# Patient Record
Sex: Female | Born: 1980 | Race: White | Hispanic: No | Smoking: Current every day smoker
Health system: Southern US, Community
[De-identification: ages and names within clinical notes are randomized; demographics above are authoritative.]

## PROBLEM LIST (undated history)

## (undated) DIAGNOSIS — K219 Gastro-esophageal reflux disease without esophagitis: Secondary | ICD-10-CM

## (undated) DIAGNOSIS — G43909 Migraine, unspecified, not intractable, without status migrainosus: Secondary | ICD-10-CM

## (undated) DIAGNOSIS — IMO0002 Reserved for concepts with insufficient information to code with codable children: Secondary | ICD-10-CM

## (undated) DIAGNOSIS — F329 Major depressive disorder, single episode, unspecified: Secondary | ICD-10-CM

## (undated) DIAGNOSIS — M1990 Unspecified osteoarthritis, unspecified site: Secondary | ICD-10-CM

## (undated) DIAGNOSIS — T8859XA Other complications of anesthesia, initial encounter: Secondary | ICD-10-CM

## (undated) DIAGNOSIS — E079 Disorder of thyroid, unspecified: Secondary | ICD-10-CM

## (undated) DIAGNOSIS — F419 Anxiety disorder, unspecified: Secondary | ICD-10-CM

## (undated) DIAGNOSIS — M549 Dorsalgia, unspecified: Secondary | ICD-10-CM

## (undated) DIAGNOSIS — F32A Depression, unspecified: Secondary | ICD-10-CM

## (undated) HISTORY — PX: CHOLECYSTECTOMY: SHX55

## (undated) HISTORY — DX: Dorsalgia, unspecified: M54.9

## (undated) HISTORY — DX: Disorder of thyroid, unspecified: E07.9

## (undated) HISTORY — PX: TONSILLECTOMY: SUR1361

---

## 2004-02-28 DIAGNOSIS — F1421 Cocaine dependence, in remission: Secondary | ICD-10-CM | POA: Insufficient documentation

## 2004-07-26 ENCOUNTER — Emergency Department (HOSPITAL_COMMUNITY): Admission: EM | Admit: 2004-07-26 | Discharge: 2004-07-27 | Payer: Self-pay | Admitting: Emergency Medicine

## 2004-08-08 ENCOUNTER — Emergency Department (HOSPITAL_COMMUNITY): Admission: EM | Admit: 2004-08-08 | Discharge: 2004-08-08 | Payer: Self-pay | Admitting: Emergency Medicine

## 2004-10-09 ENCOUNTER — Emergency Department (HOSPITAL_COMMUNITY): Admission: EM | Admit: 2004-10-09 | Discharge: 2004-10-09 | Payer: Self-pay | Admitting: Emergency Medicine

## 2004-10-25 ENCOUNTER — Emergency Department (HOSPITAL_COMMUNITY): Admission: EM | Admit: 2004-10-25 | Discharge: 2004-10-25 | Payer: Self-pay | Admitting: Emergency Medicine

## 2004-10-25 ENCOUNTER — Inpatient Hospital Stay (HOSPITAL_COMMUNITY): Admission: AD | Admit: 2004-10-25 | Discharge: 2004-10-25 | Payer: Self-pay | Admitting: Obstetrics and Gynecology

## 2004-12-25 ENCOUNTER — Emergency Department (HOSPITAL_COMMUNITY): Admission: EM | Admit: 2004-12-25 | Discharge: 2004-12-25 | Payer: Self-pay | Admitting: Emergency Medicine

## 2005-03-28 ENCOUNTER — Emergency Department (HOSPITAL_COMMUNITY): Admission: EM | Admit: 2005-03-28 | Discharge: 2005-03-28 | Payer: Self-pay | Admitting: *Deleted

## 2005-03-29 ENCOUNTER — Emergency Department (HOSPITAL_COMMUNITY): Admission: EM | Admit: 2005-03-29 | Discharge: 2005-03-29 | Payer: Self-pay | Admitting: *Deleted

## 2005-07-30 ENCOUNTER — Emergency Department (HOSPITAL_COMMUNITY): Admission: EM | Admit: 2005-07-30 | Discharge: 2005-07-30 | Payer: Self-pay | Admitting: Family Medicine

## 2005-08-30 ENCOUNTER — Inpatient Hospital Stay (HOSPITAL_COMMUNITY): Admission: AD | Admit: 2005-08-30 | Discharge: 2005-08-30 | Payer: Self-pay | Admitting: Obstetrics

## 2005-09-04 ENCOUNTER — Inpatient Hospital Stay (HOSPITAL_COMMUNITY): Admission: AD | Admit: 2005-09-04 | Discharge: 2005-09-04 | Payer: Self-pay | Admitting: Obstetrics

## 2005-09-29 ENCOUNTER — Emergency Department (HOSPITAL_COMMUNITY): Admission: EM | Admit: 2005-09-29 | Discharge: 2005-09-29 | Payer: Self-pay | Admitting: Emergency Medicine

## 2005-10-09 ENCOUNTER — Inpatient Hospital Stay (HOSPITAL_COMMUNITY): Admission: AD | Admit: 2005-10-09 | Discharge: 2005-10-10 | Payer: Self-pay | Admitting: Obstetrics

## 2005-10-27 ENCOUNTER — Inpatient Hospital Stay (HOSPITAL_COMMUNITY): Admission: AD | Admit: 2005-10-27 | Discharge: 2005-10-29 | Payer: Self-pay | Admitting: Obstetrics

## 2005-10-28 ENCOUNTER — Encounter (INDEPENDENT_AMBULATORY_CARE_PROVIDER_SITE_OTHER): Payer: Self-pay | Admitting: *Deleted

## 2006-12-14 ENCOUNTER — Emergency Department (HOSPITAL_COMMUNITY): Admission: EM | Admit: 2006-12-14 | Discharge: 2006-12-15 | Payer: Self-pay | Admitting: Emergency Medicine

## 2007-05-13 ENCOUNTER — Emergency Department (HOSPITAL_COMMUNITY): Admission: EM | Admit: 2007-05-13 | Discharge: 2007-05-13 | Payer: Self-pay | Admitting: Emergency Medicine

## 2007-07-09 ENCOUNTER — Encounter (INDEPENDENT_AMBULATORY_CARE_PROVIDER_SITE_OTHER): Payer: Self-pay | Admitting: General Surgery

## 2007-07-09 ENCOUNTER — Inpatient Hospital Stay (HOSPITAL_COMMUNITY): Admission: EM | Admit: 2007-07-09 | Discharge: 2007-07-12 | Payer: Self-pay | Admitting: Emergency Medicine

## 2009-10-30 ENCOUNTER — Emergency Department (HOSPITAL_COMMUNITY): Admission: EM | Admit: 2009-10-30 | Discharge: 2009-10-31 | Payer: Self-pay | Admitting: Emergency Medicine

## 2010-08-15 ENCOUNTER — Emergency Department (HOSPITAL_COMMUNITY): Admission: EM | Admit: 2010-08-15 | Discharge: 2010-08-16 | Payer: Self-pay | Admitting: Emergency Medicine

## 2011-01-11 LAB — DIFFERENTIAL
Basophils Absolute: 0 10*3/uL (ref 0.0–0.1)
Basophils Relative: 0 % (ref 0–1)
Eosinophils Absolute: 0.4 10*3/uL (ref 0.0–0.7)
Eosinophils Relative: 3 % (ref 0–5)
Lymphocytes Relative: 32 % (ref 12–46)
Lymphs Abs: 3.6 10*3/uL (ref 0.7–4.0)
Monocytes Absolute: 0.5 10*3/uL (ref 0.1–1.0)
Monocytes Relative: 5 % (ref 3–12)
Neutro Abs: 6.7 10*3/uL (ref 1.7–7.7)
Neutrophils Relative %: 60 % (ref 43–77)

## 2011-01-11 LAB — WET PREP, GENITAL
Trich, Wet Prep: NONE SEEN
Yeast Wet Prep HPF POC: NONE SEEN

## 2011-01-11 LAB — BASIC METABOLIC PANEL
BUN: 10 mg/dL (ref 6–23)
CO2: 30 mEq/L (ref 19–32)
Calcium: 9.2 mg/dL (ref 8.4–10.5)
Chloride: 103 mEq/L (ref 96–112)
Creatinine, Ser: 0.88 mg/dL (ref 0.4–1.2)
GFR calc Af Amer: >60 mL/min (ref >60)
GFR calc non Af Amer: >60 mL/min (ref >60)
Glucose, Bld: 109 mg/dL — ABNORMAL HIGH (ref 70–99)
Potassium: 3.9 mEq/L (ref 3.5–5.1)
Sodium: 138 mEq/L (ref 135–145)

## 2011-01-11 LAB — URINE CULTURE
Colony Count: 50000
Culture  Setup Time: 201110180300

## 2011-01-11 LAB — URINALYSIS, ROUTINE W REFLEX MICROSCOPIC
Glucose, UA: NEGATIVE mg/dL
Ketones, ur: NEGATIVE mg/dL
Nitrite: NEGATIVE
Protein, ur: NEGATIVE mg/dL
Specific Gravity, Urine: 1.028 (ref 1.005–1.030)
Urobilinogen, UA: 1 mg/dL (ref 0.0–1.0)
pH: 5 (ref 5.0–8.0)

## 2011-01-11 LAB — URINE MICROSCOPIC-ADD ON

## 2011-01-11 LAB — CBC
HCT: 39.2 % (ref 36.0–46.0)
Hemoglobin: 13.7 g/dL (ref 12.0–15.0)
MCH: 30.4 pg (ref 26.0–34.0)
MCHC: 34.9 g/dL (ref 30.0–36.0)
MCV: 87.1 fL (ref 78.0–100.0)
Platelets: 248 10*3/uL (ref 150–400)
RBC: 4.5 MIL/uL (ref 3.87–5.11)
RDW: 13.2 % (ref 11.5–15.5)
WBC: 11.1 10*3/uL — ABNORMAL HIGH (ref 4.0–10.5)

## 2011-01-11 LAB — GC/CHLAMYDIA PROBE AMP, GENITAL
Chlamydia, DNA Probe: NEGATIVE
GC Probe Amp, Genital: NEGATIVE

## 2011-01-11 LAB — PREGNANCY, URINE: Preg Test, Ur: NEGATIVE

## 2011-01-15 LAB — DIFFERENTIAL
Basophils Absolute: 0.1 10*3/uL (ref 0.0–0.1)
Basophils Relative: 1 % (ref 0–1)
Eosinophils Absolute: 0.3 10*3/uL (ref 0.0–0.7)
Eosinophils Relative: 3 % (ref 0–5)
Lymphocytes Relative: 29 % (ref 12–46)
Lymphs Abs: 2.8 10*3/uL (ref 0.7–4.0)
Monocytes Absolute: 0.5 10*3/uL (ref 0.1–1.0)
Monocytes Relative: 5 % (ref 3–12)
Neutro Abs: 6.1 10*3/uL (ref 1.7–7.7)
Neutrophils Relative %: 62 % (ref 43–77)

## 2011-01-15 LAB — D-DIMER, QUANTITATIVE: D-Dimer, Quant: 0.29 ug/mL-FEU (ref 0.00–0.48)

## 2011-01-15 LAB — CBC
HCT: 41.2 % (ref 36.0–46.0)
Hemoglobin: 14.2 g/dL (ref 12.0–15.0)
MCHC: 34.4 g/dL (ref 30.0–36.0)
MCV: 87.9 fL (ref 78.0–100.0)
Platelets: 252 10*3/uL (ref 150–400)
RBC: 4.69 MIL/uL (ref 3.87–5.11)
RDW: 13.2 % (ref 11.5–15.5)
WBC: 9.8 10*3/uL (ref 4.0–10.5)

## 2011-01-15 LAB — BASIC METABOLIC PANEL
BUN: 11 mg/dL (ref 6–23)
CO2: 28 mEq/L (ref 19–32)
Calcium: 8.9 mg/dL (ref 8.4–10.5)
Chloride: 104 mEq/L (ref 96–112)
Creatinine, Ser: 0.72 mg/dL (ref 0.4–1.2)
GFR calc Af Amer: >60 mL/min (ref >60)
GFR calc non Af Amer: >60 mL/min (ref >60)
Glucose, Bld: 99 mg/dL (ref 70–99)
Potassium: 3.9 mEq/L (ref 3.5–5.1)
Sodium: 138 mEq/L (ref 135–145)

## 2011-01-15 LAB — POCT CARDIAC MARKERS
CKMB, poc: 1.2 ng/mL (ref 1.0–8.0)
Myoglobin, poc: 52.9 ng/mL (ref 12–200)
Troponin i, poc: <0.05 ng/mL (ref 0.00–0.09)

## 2011-03-14 NOTE — Op Note (Signed)
Gina Santos, Gina Santos                ACCOUNT NO.:  [id number]   MEDICAL RECORD NO.:  [id number]          PATIENT TYPE:  INP   LOCATION:  5731                         FACILITY:  [facility]   PHYSICIAN:  Adolph Pollack, M.D.DATE OF BIRTH:  1981/07/19   DATE OF PROCEDURE:  07/09/2007  DATE OF DISCHARGE:                               OPERATIVE REPORT   PREOPERATIVE DIAGNOSIS:  Incarcerated ventral incisional hernia.   POSTOPERATIVE DIAGNOSIS:  Incarcerated ventral incisional hernia.   PROCEDURE:  Urgent repair of incarcerated ventral incisional hernia with  mesh.   SURGEON:  Adolph Pollack, M.D.   ASSISTANT:  Kelle Darting. Rennis Harding, N.P.   ANESTHESIA:  General.   INDICATIONS:  This 30 year old female had the acute onset of  periumbilical abdominal pain, nausea and vomiting, and had a painful  mass around the subumbilical incision.  A CT scan demonstrated a part of  the small bowel outside a ventral hernia and she is now brought to the  operating room for urgent repair.  I have discussed the procedure and  risks including but not limited to bleeding, infection, wound healing  problems, anesthesia, accidental injury to the intestine, recurrence  with her preoperatively.   TECHNIQUE:  She is brought to the operating room and placed on the  operating room table and general anesthetic was administered.  The  abdominal wall was sterilely prepped and draped.  Beginning in the  subumbilical incision, I reincised this incision and then made a  circumumbilical incision carrying it up into the lower epigastric region  dividing the skin and subcutaneous tissue with electrocautery.  I then  identified the hernia sac and dissected it free from the fascia.  I  opened up the hernia sac and reduced viable small bowel back into the  peritoneal cavity and excised the sac.  Using electrocautery, I then  disconnected the umbilicus from the fascia and then dissected the  subcutaneous tissue off the fascia  for an area about 4 cm around the  defect.   The defect was closed primarily in a transverse fashion with interrupted  #1 Novofil sutures with minimal tension.  A piece of polypropylene mesh  was brought into the field.  The sutures for the primary closure were  then threaded through the mesh and then the mesh was anchored directly  over the primary repair by tying down these sutures.  The periphery of  the mesh was then anchored to the fascia with 0 Novofil running suture  for an overlap of approximately 3-4 cm in all directions and excess mesh  was trimmed.   Hemostasis was adequate at this time.  The umbilicus was reimplanted to  the mesh with a 3-0 Vicryl suture.  The subcutaneous tissues were closed  over the mesh with 3-0 Vicryl suture and the skin was closed with  staples and sterile dressing was applied.  She tolerated the procedure  without apparent complications and was taken to the recovery room in  satisfactory condition.      Adolph Pollack, M.D.  Electronically Signed     TJR/MEDQ  D:  07/09/2007  T:  07/09/2007  Job:  [id number]

## 2011-03-17 NOTE — Op Note (Signed)
Gina Santos, Gina Santos                ACCOUNT NO.:  [id number]   MEDICAL RECORD NO.:  [id number]          PATIENT TYPE:  INP   LOCATION:  9140                          FACILITY:  [facility]   PHYSICIAN:  Kathreen Cosier, M.D.DATE OF BIRTH:  01/15/1981   DATE OF PROCEDURE:  10/28/2005  DATE OF DISCHARGE:                                 OPERATIVE REPORT   PREOPERATIVE DIAGNOSIS:  Multiparity.   OPERATION/PROCEDURE:  Postpartum tubal ligation.   ANESTHESIA:  Epidural.   DESCRIPTION OF PROCEDURE:  The patient placed on the operating table in the  supine position.  Abdomen prepped and draped. Bladder emptied with straight  catheter.  Midline subumbilical incision one inch long made through the old  scar and carried down to the fascia.  Fascia grasped with two Kochers and  fascia cleaned. The fascia and the peritoneum were opened with Mayo  scissors.  Left tube grasped in the mid portion with the Babcock clamp.  Tube traced to the fimbria.  A 0 plain suture placed in the portion of tube  and the mesosalpinx below the portion of tube was then clamped.  This was  tied and approximately one inch of tube transected.  Procedure done in  similar fashion on the other side.  The patient tolerated the procedure  well.  The abdomen closed in layers.  Peritoneum and fascia with continuous  suture of 4-0 Dexon and the skin closed with subcuticular stitch of 4-0  Monocryl.  Blood loss minimal.  The patient tolerated the procedure well and  taken to the recovery room in good condition.           ______________________________  Kathreen Cosier, M.D.     BAM/MEDQ  D:  10/28/2005  T:  10/28/2005  Job:  [id number]

## 2011-03-17 NOTE — Discharge Summary (Signed)
Gina Santos, Gina Santos                ACCOUNT NO.:  [id number]   MEDICAL RECORD NO.:  [id number]          PATIENT TYPE:  INP   LOCATION:  5733                         FACILITY:  [facility]   PHYSICIAN:  Adolph Pollack, M.D.DATE OF BIRTH:  [birth year]   DATE OF ADMISSION:  07/08/2007  DATE OF DISCHARGE:  07/12/2007                               DISCHARGE SUMMARY   PRINCIPAL DISCHARGE DIAGNOSIS:  Incarcerated ventral incisional hernia.   SECONDARY DIAGNOSES:  1. Partial small-bowel obstruction secondary to above.  2. Chronic back pain.  3. Depression.  4. Gastroesophageal disease.  5. Anxiety disorder.  6. Obesity.   PROCEDURE:  Repair of incarcerated ventral hernia with mesh.   REASON FOR ADMISSION:  This is a 30 year old female admitted by Dr.  Corliss Skains.  She had acute onset of periumbilical abdominal pain and nausea  and vomiting.  She presented to the emergency department and evaluation  including CT scan which demonstrated an infraumbilical hernia with a  loop of small bowel in it, leading to a partial small-bowel obstruction.  She was admitted.   She was admitted, hydrated and taken to the operating room where she  underwent the repair.  She tolerated this fairly well.  She is methadone  for chronic pain.  This was restarted as soon as possible.  She slowly  improved, was ambulatory, tolerating a diet.  She was subsequently able  to be discharged July 12, 2007.   DISPOSITION:  Discharged home July 12, 2007.  She will resume her  home medications, was given Percocet for pain and also told to take  Advil.  She was given activity restrictions and discharge instructions  to follow-up in office in 2-3 weeks.      Adolph Pollack, M.D.  Electronically Signed     TJR/MEDQ  D:  08/06/2007  T:  08/06/2007  Job:  [id number]

## 2011-03-17 NOTE — Discharge Summary (Signed)
NAMECEDAR, Gina Santos                ACCOUNT NO.:  [id number]   MEDICAL RECORD NO.:  [id number]          PATIENT TYPE:  INP   LOCATION:  9140                          [facility]AC[facility]  [facility]   PHYSICIAN:  Kathreen Cosier, M.D.DATE OF BIRTH:  1981-10-08   DATE OF ADMISSION:  10/27/2005  DATE OF DISCHARGE:  10/29/2005                                 DISCHARGE SUMMARY   The patient is a 30 year old gravida 6, para 2-0-3-2 was admitted at term in  labor and had a normal vaginal delivery on October 27, 2005. The patient  desired sterilization and on December 30 underwent postpartum tubal  ligation.  She did well. On admission her hemoglobin was 10.7, white count  10.9, platelets 247, RPR negative.  She was discharged on the second  postpartum day to see me in six weeks.   DISCHARGE DIAGNOSES:  1.  Status post normal vaginal delivery at term.  2.  Postpartum tubal ligation.           ______________________________  Kathreen Cosier, M.D.     BAM/MEDQ  D:  10/29/2005  T:  10/30/2005  Job:  [id number]

## 2011-08-11 LAB — CBC
Hemoglobin: 10.8 — ABNORMAL LOW
Hemoglobin: 13.5
MCHC: 34.1
MCHC: 34.1
MCHC: 34.4
MCV: 85
MCV: 87.3
RBC: 3.69 — ABNORMAL LOW
RBC: 3.69 — ABNORMAL LOW
RBC: 4.6
RDW: 13.8
RDW: 14
RDW: 14.1 — ABNORMAL HIGH

## 2011-08-11 LAB — WET PREP, GENITAL
Trich, Wet Prep: NONE SEEN
WBC, Wet Prep HPF POC: NONE SEEN
Yeast Wet Prep HPF POC: NONE SEEN

## 2011-08-11 LAB — DIFFERENTIAL
Basophils Absolute: 0
Basophils Relative: 0
Eosinophils Absolute: 0.3
Lymphocytes Relative: 20
Lymphocytes Relative: 35
Lymphs Abs: 2.7
Monocytes Relative: 5
Monocytes Relative: 6
Neutro Abs: 10.2 — ABNORMAL HIGH
Neutro Abs: 5.1
Neutrophils Relative %: 57
Neutrophils Relative %: 73

## 2011-08-11 LAB — BASIC METABOLIC PANEL
CO2: 27
Calcium: 7.8 — ABNORMAL LOW
Creatinine, Ser: 0.67
GFR calc Af Amer: >60
GFR calc non Af Amer: >60
Sodium: 141

## 2011-08-11 LAB — URINALYSIS, ROUTINE W REFLEX MICROSCOPIC
Bilirubin Urine: NEGATIVE
Glucose, UA: NEGATIVE
Ketones, ur: NEGATIVE
Nitrite: NEGATIVE
Protein, ur: 30 — AB
Specific Gravity, Urine: 1.027
Urobilinogen, UA: 1
pH: 7.5

## 2011-08-11 LAB — URINE CULTURE: Colony Count: >=100000

## 2011-08-11 LAB — COMPREHENSIVE METABOLIC PANEL
ALT: 35
CO2: 27
Calcium: 9
Creatinine, Ser: 0.75
GFR calc non Af Amer: >60
Glucose, Bld: 112 — ABNORMAL HIGH
Sodium: 137
Total Protein: 6.8

## 2011-08-11 LAB — URINE MICROSCOPIC-ADD ON

## 2011-08-11 LAB — PREGNANCY, URINE: Preg Test, Ur: NEGATIVE

## 2011-08-11 LAB — GC/CHLAMYDIA PROBE AMP, GENITAL
Chlamydia, DNA Probe: NEGATIVE
GC Probe Amp, Genital: NEGATIVE

## 2011-08-11 LAB — LIPASE, BLOOD: Lipase: 15

## 2011-08-15 LAB — DIFFERENTIAL
Basophils Absolute: 0
Basophils Relative: 1
Monocytes Absolute: 0.5
Neutro Abs: 4.8
Neutrophils Relative %: 57

## 2011-08-15 LAB — BASIC METABOLIC PANEL
BUN: 13
CO2: 28
Calcium: 8.9
Creatinine, Ser: 0.74
Glucose, Bld: 103 — ABNORMAL HIGH

## 2011-08-15 LAB — URINALYSIS, ROUTINE W REFLEX MICROSCOPIC
Nitrite: NEGATIVE
Specific Gravity, Urine: 1.03
Urobilinogen, UA: 1
pH: 6.5

## 2011-08-15 LAB — CBC
MCHC: 34.7
Platelets: 224
RDW: 13.8

## 2011-08-15 LAB — URINE MICROSCOPIC-ADD ON

## 2012-07-10 ENCOUNTER — Emergency Department (HOSPITAL_COMMUNITY)
Admission: EM | Admit: 2012-07-10 | Discharge: 2012-07-10 | Disposition: A | Discharge status: Home / Self Care | Payer: Self-pay | Attending: Emergency Medicine | Admitting: Emergency Medicine

## 2012-07-10 ENCOUNTER — Encounter (HOSPITAL_COMMUNITY): Payer: Self-pay | Admitting: *Deleted

## 2012-07-10 DIAGNOSIS — K089 Disorder of teeth and supporting structures, unspecified: Secondary | ICD-10-CM | POA: Insufficient documentation

## 2012-07-10 DIAGNOSIS — F17200 Nicotine dependence, unspecified, uncomplicated: Secondary | ICD-10-CM | POA: Insufficient documentation

## 2012-07-10 DIAGNOSIS — K0889 Other specified disorders of teeth and supporting structures: Secondary | ICD-10-CM

## 2012-07-10 DIAGNOSIS — F411 Generalized anxiety disorder: Secondary | ICD-10-CM | POA: Insufficient documentation

## 2012-07-10 HISTORY — DX: Major depressive disorder, single episode, unspecified: F32.9

## 2012-07-10 HISTORY — DX: Anxiety disorder, unspecified: F41.9

## 2012-07-10 HISTORY — DX: Reserved for concepts with insufficient information to code with codable children: IMO0002

## 2012-07-10 HISTORY — DX: Depression, unspecified: F32.A

## 2012-07-10 HISTORY — DX: Migraine, unspecified, not intractable, without status migrainosus: G43.909

## 2012-07-10 MED ORDER — HYDROCODONE-ACETAMINOPHEN 5-325 MG PO TABS: ORAL_TABLET | ORAL | Status: AC

## 2012-07-10 MED ORDER — PENICILLIN V POTASSIUM 500 MG PO TABS: 500.0000 mg | ORAL_TABLET | Freq: Three times a day (TID) | ORAL | Status: AC

## 2012-07-10 MED ORDER — IBUPROFEN 600 MG PO TABS: 600.0000 mg | ORAL_TABLET | Freq: Four times a day (QID) | ORAL | Status: AC | PRN

## 2012-07-10 MED ORDER — HYDROCODONE-ACETAMINOPHEN 5-325 MG PO TABS
1.0000 | ORAL_TABLET | Freq: Once | ORAL | Status: AC
  Administered 2012-07-10: 1 via ORAL
  Filled 2012-07-10: qty 1

## 2012-07-10 NOTE — ED Provider Notes (Signed)
History     CSN: [id number]  Arrival date & time 07/10/12  2110   First MD Initiated Contact with Patient 07/10/12 2215      Chief Complaint  Patient presents with  . Dental Pain    (Consider location/radiation/quality/duration/timing/severity/associated sxs/prior treatment) HPI Comments: Patient presents with right-sided upper and lower tooth pain for the past 2 days. Patient has a history of the same. Patient has been taking ibuprofen for the pain without relief. She also placed Goody powder on to the cavity without relief. Patient takes methadone for chronic back pain. She has had right ear pain but no fevers, vomiting. Onset was gradual. Course constant. Nothing makes symptoms better.  Patient is a 31 y.o. female presenting with tooth pain. The history is provided by the patient.  Dental PainThe primary symptoms include mouth pain. Primary symptoms do not include dental injury, headaches, fever, shortness of breath or sore throat. The symptoms began 2 days ago. The symptoms are unchanged. The symptoms are new. The symptoms occur constantly.  Additional symptoms include: gum tenderness and ear pain. Additional symptoms do not include: gum swelling, trismus, facial swelling and trouble swallowing.    Past Medical History  Diagnosis Date  . Migraine headache   . Herniated intervertebral disc   . Anxiety   . Depression     History reviewed. No pertinent past surgical history.  No family history on file.  History  Substance Use Topics  . Smoking status: Current Every Day Smoker -- 1.0 packs/day    Types: Cigarettes  . Smokeless tobacco: Not on file  . Alcohol Use: No    OB History    Grav Para Term Preterm Abortions TAB SAB Ect Mult Living                  Review of Systems  Constitutional: Negative for fever.  HENT: Positive for ear pain and dental problem. Negative for sore throat, facial swelling, trouble swallowing and neck pain.   Respiratory: Negative for  shortness of breath and stridor.   Skin: Negative for color change.  Neurological: Negative for headaches.    Allergies  Review of patient's allergies indicates no known allergies.  Home Medications   Current Outpatient Rx  Name Route Sig Dispense Refill  . ALPRAZOLAM 1 MG PO TABS Oral Take 1 mg by mouth at bedtime as needed. For sleep    . ASPIRIN 325 MG PO TABS Oral Take 650 mg by mouth every 4 (four) hours as needed. For toothache pain    . GOODY HEADACHE PO Oral Take by mouth once.    . DULOXETINE HCL 60 MG PO CPEP Oral Take 60 mg by mouth daily.    . IBUPROFEN 200 MG PO TABS Oral Take 800 mg by mouth every 6 (six) hours as needed. For headache    . METHADONE HCL 10 MG PO TABS Oral Take 10 mg by mouth 5 (five) times daily.    Marland Kitchen OMEPRAZOLE 20 MG PO CPDR Oral Take 20 mg by mouth daily.      BP 140/92  Pulse 86  Temp 97.2 F (36.2 C)  Resp 22  SpO2 100%  LMP 07/03/2012  Physical Exam  Nursing note and vitals reviewed. Constitutional: She appears well-developed and well-nourished.  HENT:  Head: Normocephalic and atraumatic. No trismus in the jaw.  Right Ear: Tympanic membrane, external ear and ear canal normal.  Left Ear: Tympanic membrane, external ear and ear canal normal.  Nose: Nose normal.  Mouth/Throat:  Uvula is midline, oropharynx is clear and moist and mucous membranes are normal. Abnormal dentition. Dental caries present. No dental abscesses or uvula swelling. No tonsillar abscesses.       Patient with R maxillary/mandibular tooth pain and tenderness to palpation in area of premolars. No swelling or erythema noted on exam. Poor dentition.   Eyes: Conjunctivae normal are normal.  Neck: Normal range of motion. Neck supple.       No neck swelling or Ludwig's angina  Lymphadenopathy:    She has no cervical adenopathy.  Neurological: She is alert.  Skin: Skin is warm and dry.  Psychiatric: She has a normal mood and affect.    ED Course  Procedures (including  critical care time)  Labs Reviewed - No data to display No results found.   1. Pain, dental     11:14 PM Patient seen and examined. Medication ordered.   Vital signs reviewed and are as follows: Filed Vitals:   07/10/12 2218  BP: 140/92  Pulse: 86  Temp: 97.2 F (36.2 C)  Resp: 22   11:14 PM Patient counseled to take prescribed medications as directed, return with worsening facial or neck swelling, and to follow-up with his dentist as soon as possible. Talked with patient about taking Vicodin as prescribed. Discussed dangers of taking with methadone. Patient verbalizes understanding and agrees with plan.   MDM  Patient with toothache.  No gross abscess.  Exam unconcerning for Ludwig's angina or other deep tissue infection in neck.  Will treat with penicillin and pain medicine.  Urged patient to follow-up with dentist.           Renne Crigler, PA 07/12/12 [phone]

## 2012-07-10 NOTE — ED Notes (Signed)
Pt w/ rt lower dental pain x2 days - pt was seen in July and told she has an abscess and will need to have the tooth pulled however she does not have money to have it pulled. Pt grimacing and moaning on assessment - has been taking ibuprofen at home w/o relief.

## 2012-07-10 NOTE — ED Notes (Signed)
Discharge instructions reviewed w/ pt., verbalizes understanding. Three prescriptions provided at discharge. 

## 2012-07-15 NOTE — ED Provider Notes (Signed)
Medical screening examination/treatment/procedure(s) were performed by non-physician practitioner and as supervising physician I was immediately available for consultation/collaboration  Harrold Donath R. Rubin Payor, MD 07/15/12 2316

## 2013-08-09 ENCOUNTER — Emergency Department (HOSPITAL_COMMUNITY)
Admission: EM | Admit: 2013-08-09 | Discharge: 2013-08-09 | Disposition: A | Discharge status: Home / Self Care | Payer: Self-pay | Attending: Emergency Medicine | Admitting: Emergency Medicine

## 2013-08-09 ENCOUNTER — Encounter (HOSPITAL_COMMUNITY): Payer: Self-pay | Admitting: Emergency Medicine

## 2013-08-09 DIAGNOSIS — R197 Diarrhea, unspecified: Secondary | ICD-10-CM | POA: Insufficient documentation

## 2013-08-09 DIAGNOSIS — M545 Low back pain, unspecified: Secondary | ICD-10-CM | POA: Insufficient documentation

## 2013-08-09 DIAGNOSIS — F3289 Other specified depressive episodes: Secondary | ICD-10-CM | POA: Insufficient documentation

## 2013-08-09 DIAGNOSIS — F329 Major depressive disorder, single episode, unspecified: Secondary | ICD-10-CM | POA: Insufficient documentation

## 2013-08-09 DIAGNOSIS — R112 Nausea with vomiting, unspecified: Secondary | ICD-10-CM | POA: Insufficient documentation

## 2013-08-09 DIAGNOSIS — Z8679 Personal history of other diseases of the circulatory system: Secondary | ICD-10-CM | POA: Insufficient documentation

## 2013-08-09 DIAGNOSIS — F17200 Nicotine dependence, unspecified, uncomplicated: Secondary | ICD-10-CM | POA: Insufficient documentation

## 2013-08-09 DIAGNOSIS — F1110 Opioid abuse, uncomplicated: Secondary | ICD-10-CM

## 2013-08-09 DIAGNOSIS — G8929 Other chronic pain: Secondary | ICD-10-CM | POA: Insufficient documentation

## 2013-08-09 DIAGNOSIS — F411 Generalized anxiety disorder: Secondary | ICD-10-CM | POA: Insufficient documentation

## 2013-08-09 DIAGNOSIS — Z3202 Encounter for pregnancy test, result negative: Secondary | ICD-10-CM | POA: Insufficient documentation

## 2013-08-09 DIAGNOSIS — R61 Generalized hyperhidrosis: Secondary | ICD-10-CM | POA: Insufficient documentation

## 2013-08-09 DIAGNOSIS — G479 Sleep disorder, unspecified: Secondary | ICD-10-CM | POA: Insufficient documentation

## 2013-08-09 DIAGNOSIS — F1120 Opioid dependence, uncomplicated: Secondary | ICD-10-CM | POA: Insufficient documentation

## 2013-08-09 DIAGNOSIS — Z79899 Other long term (current) drug therapy: Secondary | ICD-10-CM | POA: Insufficient documentation

## 2013-08-09 DIAGNOSIS — F19939 Other psychoactive substance use, unspecified with withdrawal, unspecified: Secondary | ICD-10-CM | POA: Insufficient documentation

## 2013-08-09 LAB — RAPID URINE DRUG SCREEN, HOSP PERFORMED
Benzodiazepines: POSITIVE — AB
Cocaine: NOT DETECTED
Opiates: NOT DETECTED
Tetrahydrocannabinol: NOT DETECTED

## 2013-08-09 LAB — COMPREHENSIVE METABOLIC PANEL
ALT: 17 U/L (ref 0–35)
AST: 10 U/L (ref 0–37)
Calcium: 9.7 mg/dL (ref 8.4–10.5)
GFR calc Af Amer: >90 mL/min (ref >90)
Glucose, Bld: 101 mg/dL — ABNORMAL HIGH (ref 70–99)
Sodium: 138 mEq/L (ref 135–145)
Total Protein: 8 g/dL (ref 6.0–8.3)

## 2013-08-09 LAB — CBC
HCT: 44.4 % (ref 36.0–46.0)
Hemoglobin: 15.9 g/dL — ABNORMAL HIGH (ref 12.0–15.0)
MCH: 30.3 pg (ref 26.0–34.0)
MCHC: 35.8 g/dL (ref 30.0–36.0)
Platelets: 222 10*3/uL (ref 150–400)

## 2013-08-09 LAB — ETHANOL: Alcohol, Ethyl (B): <11 mg/dL (ref 0–11)

## 2013-08-09 LAB — SALICYLATE LEVEL: Salicylate Lvl: <2 mg/dL — ABNORMAL LOW (ref 2.8–20.0)

## 2013-08-09 MED ORDER — ALPRAZOLAM 1 MG PO TABS: 1.0000 mg | ORAL_TABLET | Freq: Three times a day (TID) | ORAL | Status: DC | PRN

## 2013-08-09 MED ORDER — PROMETHAZINE HCL 25 MG RE SUPP: 25.0000 mg | Freq: Four times a day (QID) | RECTAL | Status: DC | PRN

## 2013-08-09 MED ORDER — PROMETHAZINE HCL 25 MG/ML IJ SOLN
25.0000 mg | Freq: Once | INTRAMUSCULAR | Status: AC
  Administered 2013-08-09: 25 mg via INTRAMUSCULAR
  Filled 2013-08-09: qty 1

## 2013-08-09 MED ORDER — LOPERAMIDE HCL 2 MG PO CAPS: 2.0000 mg | ORAL_CAPSULE | Freq: Four times a day (QID) | ORAL | Status: DC | PRN

## 2013-08-09 MED ORDER — ONDANSETRON 8 MG PO TBDP
8.0000 mg | ORAL_TABLET | Freq: Once | ORAL | Status: AC
  Administered 2013-08-09: 8 mg via ORAL
  Filled 2013-08-09: qty 1

## 2013-08-09 MED ORDER — LOPERAMIDE HCL 2 MG PO CAPS
4.0000 mg | ORAL_CAPSULE | Freq: Once | ORAL | Status: AC
  Administered 2013-08-09: 4 mg via ORAL
  Filled 2013-08-09 (×2): qty 2

## 2013-08-09 MED ORDER — METOCLOPRAMIDE HCL 5 MG/ML IJ SOLN
10.0000 mg | Freq: Once | INTRAMUSCULAR | Status: AC
  Administered 2013-08-09: 10 mg via INTRAMUSCULAR
  Filled 2013-08-09: qty 2

## 2013-08-09 MED ORDER — KETOROLAC TROMETHAMINE 60 MG/2ML IM SOLN
60.0000 mg | Freq: Once | INTRAMUSCULAR | Status: AC
  Administered 2013-08-09: 60 mg via INTRAMUSCULAR
  Filled 2013-08-09: qty 2

## 2013-08-09 MED ORDER — PROMETHAZINE HCL 25 MG PO TABS: 25.0000 mg | ORAL_TABLET | Freq: Four times a day (QID) | ORAL | Status: DC | PRN

## 2013-08-09 MED ORDER — METHOCARBAMOL 500 MG PO TABS: 500.0000 mg | ORAL_TABLET | Freq: Two times a day (BID) | ORAL | Status: DC

## 2013-08-09 NOTE — ED Provider Notes (Signed)
CSN: [id number]     Arrival date & time 08/09/13  1543 History This chart was scribed for non-physician practitioner working with Vida Roller, MD by Ashley Jacobs, ED scribe. This patient was seen in room [facility] and the patient's care was started at 5:19 PM    First MD Initiated Contact with Patient 08/09/13 1641     Chief Complaint  Patient presents with  . Withdrawal   (Consider location/radiation/quality/duration/timing/severity/associated sxs/prior Treatment) The history is provided by the patient and medical records. No language interpreter was used.   HPI Comments: Gina Santos is a 32 y.o. female who presents to the Emergency Department for symptomatic withdrawal from [facility] for the past two days. She has been on opitates for the past 15 years due to chronic pain. Her last opiate intake was 13 hours PTA.  She is experiencing throbbing, constant, severe, lower lumbar leg pain which she treated with 3 (800mg  Ibuprofen). This pain is a chronic back pain that she takes her coccyx 4. Pt is also experiencing the associated symptoms of vomiting, sleep disturbances/restlessness, diaphoresis and diarrhea. Patient reports she was initially obtaining pain medications at a pain clinic here in [place] however after failing a drug test she was discharged from the practice. She transferred to a practice in [place] for some time before she was also discharged. Pt reports that she has an appointment set for 10/14. Patient denies wish to detox and demands narcotic medications  Pt does not have any known allergies. He has a medical hx of migraine headaches, anxiety and depression.     Past Medical History  Diagnosis Date  . Migraine headache   . Herniated intervertebral disc   . Anxiety   . Depression    Past Surgical History  Procedure Laterality Date  . Tonsillectomy    . Cholecystectomy     No family history on file. History  Substance Use Topics  . Smoking status:  Current Every Day Smoker -- 1.00 packs/day    Types: Cigarettes  . Smokeless tobacco: Not on file  . Alcohol Use: No   OB History   Grav Para Term Preterm Abortions TAB SAB Ect Mult Living                 Review of Systems  Constitutional: Positive for diaphoresis.  Gastrointestinal: Positive for nausea, vomiting and diarrhea.  Genitourinary: Negative for frequency.  Musculoskeletal: Positive for arthralgias and myalgias.  Psychiatric/Behavioral: Positive for sleep disturbance.  All other systems reviewed and are negative.    Allergies  Review of patient's allergies indicates no known allergies.  Home Medications   Current Outpatient Rx  Name  Route  Sig  Dispense  Refill  . ALPRAZolam (XANAX) 1 MG tablet   Oral   Take 1 mg by mouth 3 (three) times daily as needed. For sleep         . methocarbamol (ROBAXIN) 750 MG tablet   Oral   Take 750 mg by mouth 3 (three) times daily as needed.         Marland Kitchen omeprazole (PRILOSEC) 20 MG capsule   Oral   Take 20 mg by mouth daily.         Marland Kitchen venlafaxine XR (EFFEXOR-XR) 75 MG 24 hr capsule   Oral   Take 75 mg by mouth daily.         Marland Kitchen ALPRAZolam (XANAX) 1 MG tablet   Oral   Take 1 tablet (1 mg total) by mouth 3 (three)  times daily as needed for anxiety.   6 tablet   0   . loperamide (IMODIUM) 2 MG capsule   Oral   Take 1 capsule (2 mg total) by mouth 4 (four) times daily as needed for diarrhea or loose stools.   12 capsule   0   . methocarbamol (ROBAXIN) 500 MG tablet   Oral   Take 1 tablet (500 mg total) by mouth 2 (two) times daily.   20 tablet   0   . promethazine (PHENERGAN) 25 MG suppository   Rectal   Place 1 suppository (25 mg total) rectally every 6 (six) hours as needed for nausea.   12 each   0   . promethazine (PHENERGAN) 25 MG tablet   Oral   Take 1 tablet (25 mg total) by mouth every 6 (six) hours as needed for nausea.   12 tablet   0    BP 117/99  Pulse 103  Temp(Src) 98.3 F (36.8 C)  (Oral)  Resp 18  SpO2 100%  LMP 08/02/2013 Physical Exam  Nursing note and vitals reviewed. Constitutional: She is oriented to person, place, and time. She appears well-developed and well-nourished. No distress.  Awake, alert, nontoxic appearance  HENT:  Head: Normocephalic and atraumatic.  Mouth/Throat: Oropharynx is clear and moist. No oropharyngeal exudate.  Eyes: Conjunctivae are normal. Pupils are equal, round, and reactive to light. No scleral icterus.  Neck: Normal range of motion. Neck supple.  Full ROM without pain  Cardiovascular: Normal rate, regular rhythm, normal heart sounds and intact distal pulses.   No murmur heard. No tachycardia  Pulmonary/Chest: Effort normal and breath sounds normal. No stridor. No respiratory distress. She has no wheezes. She has no rales.  Abdominal: Soft. Bowel sounds are normal. She exhibits no distension and no mass. There is no tenderness. There is no rebound and no guarding.  Abdomen soft and nontender  Musculoskeletal: Normal range of motion. She exhibits no edema.  Full range of motion of the T-spine and L-spine No tenderness to palpation of the spinous processes of the T-spine or L-spine Tenderness to palpation of the paraspinous muscles of the L-spine  Lymphadenopathy:    She has no cervical adenopathy.  Neurological: She is alert and oriented to person, place, and time. She has normal reflexes. She exhibits normal muscle tone. Coordination normal.  Speech is clear and goal oriented, follows commands Normal strength in upper and lower extremities bilaterally including dorsiflexion and plantar flexion, strong and equal grip strength Sensation normal to light and sharp touch Moves extremities without ataxia, coordination intact Normal gait Normal balance   Skin: Skin is warm and dry. No rash noted. She is not diaphoretic. No erythema.  Psychiatric: She has a normal mood and affect. Her behavior is normal.    ED Course  Procedures  (including critical care time) DIAGNOSTIC STUDIES: Oxygen Saturation is 100% on room air , normal by my interpretation.    COORDINATION OF CARE: 5:22 PM Discussed course of care with pt . Pt understands and agrees.  Labs Review Labs Reviewed  CBC - Abnormal; Notable for the following:    WBC 14.8 (*)    RBC 5.25 (*)    Hemoglobin 15.9 (*)    All other components within normal limits  COMPREHENSIVE METABOLIC PANEL - Abnormal; Notable for the following:    Glucose, Bld 101 (*)    BUN 5 (*)    GFR calc non Af Amer 90 (*)    All other components  within normal limits  SALICYLATE LEVEL - Abnormal; Notable for the following:    Salicylate Lvl <2.0 (*)    All other components within normal limits  URINE RAPID DRUG SCREEN (HOSP PERFORMED) - Abnormal; Notable for the following:    Benzodiazepines POSITIVE (*)    All other components within normal limits  ACETAMINOPHEN LEVEL  ETHANOL  POCT PREGNANCY, URINE   Imaging Review No results found.  EKG Interpretation   None       MDM   1. Narcotic abuse   2. Nausea and vomiting   3. Diarrhea      Liberti Appleton presents with chronic low back pain and opiate withdrawal symptoms.  He reports that she usually takes Xanax at home for her anxiety and continues to do so.  She reports that she took her last index today and her new shipment will arrive on Monday. I have agreed to write you a prescription. [facility] and was given an appointment for Tuesday. She requests that I prescribe methadone for her.  She endorses nausea, vomiting, diarrhea and diaphoresis at home. Patient given anti-emetics here in the emergency department and she is able to tolerate clear liquids without difficulty.  Patient's medical clearance labs obtained. Patient on her mild leukocytosis likely stress induced; patient is afebrile. She is no longer tachycardic and is tolerating fluids.  His membranes are moist and patient does  not appear significantly dehydrated.   She has urinated several times here in the emergency department. We'll discharge home with symptomatic treatment for her acute opiate withdrawal. Patient also given today prescription of her Xanax to prevent into withdrawal. Patient denies use of alcohol. No opiates will be prescribed.   It has been determined that no acute conditions requiring further emergency intervention are present at this time. The patient/guardian have been advised of the diagnosis and plan. We have discussed signs and symptoms that warrant return to the ED, such as changes or worsening in symptoms.   Vital signs are stable at discharge.   BP 125/71  Pulse 86  Temp(Src) 97.7 F (36.5 C) (Oral)  Resp 18  SpO2 100%  LMP 08/02/2013  Patient/guardian has voiced understanding and agreed to follow-up with the PCP or specialist.  I personally performed the services described in this documentation, which was scribed in my presence. The recorded information has been reviewed and is accurate. Dahlia Client Vennie Salsbury, PA-C 08/09/13 2102

## 2013-08-09 NOTE — ED Notes (Addendum)
Pt from home, reports that she a chronic pain pt, 5 herniated discs. Last opiate was 3- 5mg  hydrocodone, xanax, Methocarbamol yesterday . Pt has been on rx pain meds x 69yrs. Pt went to [facility] Thursday and was told that she would not be tx until Tuesday. Pt c/o uncontrollable diarrhea, weakness, vision changes, N/V. Pt wants to return to [facility] on Tuesday, but wants to be tx now for her chronic pain. Pt is A&O and in NAD

## 2013-08-09 NOTE — ED Notes (Signed)
Pt. Belongings are ( plaid shirt,green pants, black and pink slide shoes, tongue ring, 2 necklaces, 2 bracllets, tan purse, hair band holder) nurse was notified.

## 2013-08-10 NOTE — ED Provider Notes (Signed)
Medical screening examination/treatment/procedure(s) were performed by non-physician practitioner and as supervising physician I was immediately available for consultation/collaboration.    Hildred Pharo D Yurani Fettes, MD 08/10/13 2001 

## 2015-02-18 ENCOUNTER — Emergency Department (HOSPITAL_COMMUNITY): Payer: Medicaid Other

## 2015-02-18 ENCOUNTER — Encounter (HOSPITAL_COMMUNITY): Payer: Self-pay | Admitting: Emergency Medicine

## 2015-02-18 ENCOUNTER — Emergency Department (HOSPITAL_COMMUNITY)
Admission: EM | Admit: 2015-02-18 | Discharge: 2015-02-18 | Disposition: A | Discharge status: Home / Self Care | Payer: Medicaid Other | Attending: Emergency Medicine | Admitting: Emergency Medicine

## 2015-02-18 DIAGNOSIS — F419 Anxiety disorder, unspecified: Secondary | ICD-10-CM | POA: Diagnosis not present

## 2015-02-18 DIAGNOSIS — R600 Localized edema: Secondary | ICD-10-CM

## 2015-02-18 DIAGNOSIS — Z8679 Personal history of other diseases of the circulatory system: Secondary | ICD-10-CM | POA: Diagnosis not present

## 2015-02-18 DIAGNOSIS — R2243 Localized swelling, mass and lump, lower limb, bilateral: Secondary | ICD-10-CM | POA: Insufficient documentation

## 2015-02-18 DIAGNOSIS — Z79899 Other long term (current) drug therapy: Secondary | ICD-10-CM | POA: Diagnosis not present

## 2015-02-18 DIAGNOSIS — Z720 Tobacco use: Secondary | ICD-10-CM | POA: Diagnosis not present

## 2015-02-18 DIAGNOSIS — F329 Major depressive disorder, single episode, unspecified: Secondary | ICD-10-CM | POA: Diagnosis not present

## 2015-02-18 LAB — BASIC METABOLIC PANEL
Anion gap: 6 (ref 5–15)
BUN: 14 mg/dL (ref 6–23)
CALCIUM: 8.9 mg/dL (ref 8.4–10.5)
CO2: 27 mmol/L (ref 19–32)
CREATININE: 0.86 mg/dL (ref 0.50–1.10)
Chloride: 106 mmol/L (ref 96–112)
GFR calc Af Amer: >90 mL/min (ref >90)
GFR calc non Af Amer: 87 mL/min — ABNORMAL LOW (ref >90)
Glucose, Bld: 89 mg/dL (ref 70–99)
Potassium: 3.6 mmol/L (ref 3.5–5.1)
Sodium: 139 mmol/L (ref 135–145)

## 2015-02-18 LAB — CBC WITH DIFFERENTIAL/PLATELET
Basophils Absolute: 0 10*3/uL (ref 0.0–0.1)
Basophils Relative: 0 % (ref 0–1)
EOS PCT: 3 % (ref 0–5)
Eosinophils Absolute: 0.3 10*3/uL (ref 0.0–0.7)
HCT: 38.7 % (ref 36.0–46.0)
HEMOGLOBIN: 12.8 g/dL (ref 12.0–15.0)
LYMPHS ABS: 3.1 10*3/uL (ref 0.7–4.0)
LYMPHS PCT: 31 % (ref 12–46)
MCH: 30 pg (ref 26.0–34.0)
MCHC: 33.1 g/dL (ref 30.0–36.0)
MCV: 90.6 fL (ref 78.0–100.0)
MONO ABS: 0.7 10*3/uL (ref 0.1–1.0)
Monocytes Relative: 7 % (ref 3–12)
NEUTROS ABS: 5.8 10*3/uL (ref 1.7–7.7)
Neutrophils Relative %: 59 % (ref 43–77)
Platelets: 236 10*3/uL (ref 150–400)
RBC: 4.27 MIL/uL (ref 3.87–5.11)
RDW: 13.2 % (ref 11.5–15.5)
WBC: 10 10*3/uL (ref 4.0–10.5)

## 2015-02-18 LAB — TROPONIN I: Troponin I: <0.03 ng/mL (ref <0.031)

## 2015-02-18 LAB — D-DIMER, QUANTITATIVE (NOT AT ARMC)

## 2015-02-18 LAB — BRAIN NATRIURETIC PEPTIDE: B Natriuretic Peptide: 35.1 pg/mL (ref 0.0–100.0)

## 2015-02-18 MED ORDER — POTASSIUM CHLORIDE ER 10 MEQ PO TBCR: 10.0000 meq | EXTENDED_RELEASE_TABLET | Freq: Every day | ORAL | Status: DC

## 2015-02-18 MED ORDER — DOXYCYCLINE HYCLATE 100 MG PO CAPS: 100.0000 mg | ORAL_CAPSULE | Freq: Two times a day (BID) | ORAL | Status: DC

## 2015-02-18 MED ORDER — DOXYCYCLINE HYCLATE 100 MG PO TABS
100.0000 mg | ORAL_TABLET | Freq: Once | ORAL | Status: DC
  Filled 2015-02-18: qty 1

## 2015-02-18 MED ORDER — FUROSEMIDE 20 MG PO TABS: 20.0000 mg | ORAL_TABLET | Freq: Every day | ORAL | Status: DC

## 2015-02-18 NOTE — ED Provider Notes (Signed)
CSN: [id number]     Arrival date & time 02/18/15  1752 History   First MD Initiated Contact with Patient 02/18/15 1811     Chief Complaint  Patient presents with  . Leg Swelling    The history is provided by the patient. No language interpreter was used.   Ms. Gina Santos presents for evaluation of lower extremity edema. She states that for the last 3 days she has had increased swelling in bilateral lower extremities. First one will feel more swollen than the other one will follow. She denies any injuries. She states that they feel tight especially when she bends her legs. She developed redness of bilateral lower extremity today. She denies any fevers, abdominal pain, vomiting, change in urination. She did have increased dyspnea and some chest tightness while exercising yesterday, otherwise she denies any chest pain or shortness of breath. She takes no oral estrogens, no recent surgeries, no recent medication changes. She denies any history of heart disease, lung disease, blood clots. Symptoms are moderate, constant, worsening. No IV drug use.  Past Medical History  Diagnosis Date  . Migraine headache   . Herniated intervertebral disc   . Anxiety   . Depression    Past Surgical History  Procedure Laterality Date  . Tonsillectomy    . Cholecystectomy     History reviewed. No pertinent family history. History  Substance Use Topics  . Smoking status: Current Every Day Smoker -- 1.00 packs/day    Types: Cigarettes  . Smokeless tobacco: Not on file  . Alcohol Use: No   OB History    No data available     Review of Systems  All other systems reviewed and are negative.     Allergies  Review of patient's allergies indicates no known allergies.  Home Medications   Prior to Admission medications   Medication Sig Start Date End Date Taking? Authorizing Provider  DULoxetine (CYMBALTA) 60 MG capsule Take 60 mg by mouth daily.   Yes Historical Provider, MD  methadone (DOLOPHINE) 10 MG/ML  solution Take 125 mg by mouth daily.   Yes Historical Provider, MD  omeprazole (PRILOSEC) 20 MG capsule Take 20 mg by mouth daily.   Yes Historical Provider, MD  ALPRAZolam Prudy Feeler(XANAX) 1 MG tablet Take 1 tablet (1 mg total) by mouth 3 (three) times daily as needed for anxiety. Patient not taking: Reported on 02/18/2015 08/09/13   Dahlia ClientHannah Muthersbaugh, PA-C  loperamide (IMODIUM) 2 MG capsule Take 1 capsule (2 mg total) by mouth 4 (four) times daily as needed for diarrhea or loose stools. Patient not taking: Reported on 02/18/2015 08/09/13   Dahlia ClientHannah Muthersbaugh, PA-C  methocarbamol (ROBAXIN) 500 MG tablet Take 1 tablet (500 mg total) by mouth 2 (two) times daily. Patient not taking: Reported on 02/18/2015 08/09/13   Dahlia ClientHannah Muthersbaugh, PA-C  promethazine (PHENERGAN) 25 MG suppository Place 1 suppository (25 mg total) rectally every 6 (six) hours as needed for nausea. Patient not taking: Reported on 02/18/2015 08/09/13   Dahlia ClientHannah Muthersbaugh, PA-C  promethazine (PHENERGAN) 25 MG tablet Take 1 tablet (25 mg total) by mouth every 6 (six) hours as needed for nausea. Patient not taking: Reported on 02/18/2015 08/09/13   Dahlia ClientHannah Muthersbaugh, PA-C   BP 138/73 mmHg  Pulse 97  Temp(Src) 98.6 F (37 C)  Resp 18  SpO2 98% Physical Exam  Constitutional: She is oriented to person, place, and time. She appears well-developed and well-nourished.  HENT:  Head: Normocephalic and atraumatic.  Cardiovascular: Normal rate and regular rhythm.  No murmur heard. Pulmonary/Chest: Effort normal and breath sounds normal. No respiratory distress.  Abdominal: Soft. There is no tenderness. There is no rebound and no guarding.  Musculoskeletal:  Large amount of nonpitting bilateral lower extremity edema. Mild erythema from the knees to the feet bilaterally. Faint DP pulses bilaterally. Mild diffuse tenderness of bilateral lower extremities.  Neurological: She is alert and oriented to person, place, and time.  Skin: Skin is  warm and dry.  Psychiatric: She has a normal mood and affect. Her behavior is normal.  Nursing note and vitals reviewed.   ED Course  Procedures (including critical care time) Labs Review Labs Reviewed  BASIC METABOLIC PANEL - Abnormal; Notable for the following:    GFR calc non Af Amer 87 (*)    All other components within normal limits  CBC WITH DIFFERENTIAL/PLATELET  BRAIN NATRIURETIC PEPTIDE  TROPONIN I  D-DIMER, QUANTITATIVE    Imaging Review Dg Chest 2 View  02/18/2015   CLINICAL DATA:  Acute bilateral lower leg swelling.  EXAM: CHEST  2 VIEW  COMPARISON:  10/30/2009  FINDINGS: Heart size and pulmonary vascularity are normal and the lungs are clear. No effusions. No osseous abnormality.  IMPRESSION: No acute disease.   Electronically Signed   By: Francene Boyers M.D.   On: 02/18/2015 19:38     EKG Interpretation None      MDM   Final diagnoses:  Bilateral edema of lower extremity    Patient here for evaluation of lower extremity edema. Patient does have edema on her extremities on examination but there is no evidence of heart failure or renal failure. There is some erythema present, question an early cellulitis, treated with antibiotics. Provided prescription for Lasix for symptomatic relief of edema. Patient is low risk for DVT/PE and d-dimer is negative. Discussed with patient help care for lower extremity edema as well as close PCP follow-up and return precautions.    Tilden Fossa, MD 02/18/15 [phone]

## 2015-02-18 NOTE — ED Notes (Signed)
Pt c/o bilateral leg swelling, erythema, and pain onset 3 days ago, worsened significantly today. Motor function and sensation intact, capillary refill good.

## 2015-02-18 NOTE — Discharge Instructions (Signed)
Peripheral Edema °You have swelling in your legs (peripheral edema). This swelling is due to excess accumulation of salt and water in your body. Edema may be a sign of heart, kidney or liver disease, or a side effect of a medication. It may also be due to problems in the leg veins. Elevating your legs and using special support stockings may be very helpful, if the cause of the swelling is due to poor venous circulation. Avoid long periods of standing, whatever the cause. °Treatment of edema depends on identifying the cause. Chips, pretzels, pickles and other salty foods should be avoided. Restricting salt in your diet is almost always needed. Water pills (diuretics) are often used to remove the excess salt and water from your body via urine. These medicines prevent the kidney from reabsorbing sodium. This increases urine flow. °Diuretic treatment may also result in lowering of potassium levels in your body. Potassium supplements may be needed if you have to use diuretics daily. Daily weights can help you keep track of your progress in clearing your edema. You should call your caregiver for follow up care as recommended. °SEEK IMMEDIATE MEDICAL CARE IF:  °· You have increased swelling, pain, redness, or heat in your legs. °· You develop shortness of breath, especially when lying down. °· You develop chest or abdominal pain, weakness, or fainting. °· You have a fever. °Document Released: 11/23/2004 Document Revised: 01/08/2012 Document Reviewed: 11/03/2009 °Exit[employer]® Patient Information ©2015 [employer]. This information is not intended to replace advice given to you by your health care provider. Make sure you discuss any questions you have with your health care provider. ° °

## 2015-02-18 NOTE — ED Notes (Signed)
Nurse currently starting IV 

## 2015-03-21 ENCOUNTER — Encounter (HOSPITAL_COMMUNITY): Payer: Self-pay | Admitting: Emergency Medicine

## 2015-03-21 ENCOUNTER — Inpatient Hospital Stay (HOSPITAL_COMMUNITY)
Admission: EM | Admit: 2015-03-21 | Discharge: 2015-03-23 | DRG: 603 | Disposition: A | Discharge status: Home / Self Care | Payer: Non-veteran care | Attending: Internal Medicine | Admitting: Internal Medicine

## 2015-03-21 ENCOUNTER — Emergency Department (HOSPITAL_BASED_OUTPATIENT_CLINIC_OR_DEPARTMENT_OTHER)
Admit: 2015-03-21 | Discharge: 2015-03-21 | Disposition: A | Discharge status: Home / Self Care | Payer: Non-veteran care | Attending: Emergency Medicine | Admitting: Emergency Medicine

## 2015-03-21 DIAGNOSIS — Z6841 Body Mass Index (BMI) 40.0 and over, adult: Secondary | ICD-10-CM

## 2015-03-21 DIAGNOSIS — Z79891 Long term (current) use of opiate analgesic: Secondary | ICD-10-CM

## 2015-03-21 DIAGNOSIS — R1013 Epigastric pain: Secondary | ICD-10-CM | POA: Diagnosis present

## 2015-03-21 DIAGNOSIS — Z720 Tobacco use: Secondary | ICD-10-CM | POA: Diagnosis present

## 2015-03-21 DIAGNOSIS — G8929 Other chronic pain: Secondary | ICD-10-CM | POA: Diagnosis present

## 2015-03-21 DIAGNOSIS — Z833 Family history of diabetes mellitus: Secondary | ICD-10-CM

## 2015-03-21 DIAGNOSIS — M549 Dorsalgia, unspecified: Secondary | ICD-10-CM | POA: Diagnosis present

## 2015-03-21 DIAGNOSIS — L0390 Cellulitis, unspecified: Secondary | ICD-10-CM | POA: Insufficient documentation

## 2015-03-21 DIAGNOSIS — L03115 Cellulitis of right lower limb: Principal | ICD-10-CM | POA: Diagnosis present

## 2015-03-21 DIAGNOSIS — F17210 Nicotine dependence, cigarettes, uncomplicated: Secondary | ICD-10-CM | POA: Diagnosis present

## 2015-03-21 DIAGNOSIS — G43909 Migraine, unspecified, not intractable, without status migrainosus: Secondary | ICD-10-CM | POA: Diagnosis present

## 2015-03-21 DIAGNOSIS — F419 Anxiety disorder, unspecified: Secondary | ICD-10-CM | POA: Diagnosis present

## 2015-03-21 DIAGNOSIS — F329 Major depressive disorder, single episode, unspecified: Secondary | ICD-10-CM | POA: Diagnosis present

## 2015-03-21 LAB — CBC WITH DIFFERENTIAL/PLATELET
BASOS ABS: 0 10*3/uL (ref 0.0–0.1)
BASOS PCT: 0 % (ref 0–1)
EOS ABS: 0.2 10*3/uL (ref 0.0–0.7)
Eosinophils Relative: 2 % (ref 0–5)
HCT: 38.5 % (ref 36.0–46.0)
Hemoglobin: 13 g/dL (ref 12.0–15.0)
Lymphocytes Relative: 25 % (ref 12–46)
Lymphs Abs: 2.4 10*3/uL (ref 0.7–4.0)
MCH: 30.1 pg (ref 26.0–34.0)
MCHC: 33.8 g/dL (ref 30.0–36.0)
MCV: 89.1 fL (ref 78.0–100.0)
Monocytes Absolute: 0.6 10*3/uL (ref 0.1–1.0)
Monocytes Relative: 6 % (ref 3–12)
NEUTROS PCT: 67 % (ref 43–77)
Neutro Abs: 6.3 10*3/uL (ref 1.7–7.7)
Platelets: 222 10*3/uL (ref 150–400)
RBC: 4.32 MIL/uL (ref 3.87–5.11)
RDW: 13.3 % (ref 11.5–15.5)
WBC: 9.6 10*3/uL (ref 4.0–10.5)

## 2015-03-21 LAB — COMPREHENSIVE METABOLIC PANEL
ALT: 22 U/L (ref 14–54)
ANION GAP: 9 (ref 5–15)
AST: 18 U/L (ref 15–41)
Albumin: 3.7 g/dL (ref 3.5–5.0)
Alkaline Phosphatase: 62 U/L (ref 38–126)
BUN: 10 mg/dL (ref 6–20)
CHLORIDE: 103 mmol/L (ref 101–111)
CO2: 24 mmol/L (ref 22–32)
CREATININE: 0.79 mg/dL (ref 0.44–1.00)
Calcium: 9.2 mg/dL (ref 8.9–10.3)
Glucose, Bld: 109 mg/dL — ABNORMAL HIGH (ref 65–99)
Potassium: 4.4 mmol/L (ref 3.5–5.1)
Sodium: 136 mmol/L (ref 135–145)
TOTAL PROTEIN: 7 g/dL (ref 6.5–8.1)
Total Bilirubin: 0.6 mg/dL (ref 0.3–1.2)

## 2015-03-21 MED ORDER — ALUM & MAG HYDROXIDE-SIMETH 200-200-20 MG/5ML PO SUSP: 30.0000 mL | Freq: Four times a day (QID) | ORAL | Status: DC | PRN

## 2015-03-21 MED ORDER — SODIUM CHLORIDE 0.9 % IV SOLN: 250.0000 mL | INTRAVENOUS | Status: DC | PRN

## 2015-03-21 MED ORDER — METHADONE HCL 10 MG/ML PO CONC: 125.0000 mg | Freq: Every day | ORAL | Status: DC

## 2015-03-21 MED ORDER — NICOTINE 21 MG/24HR TD PT24
21.0000 mg | MEDICATED_PATCH | Freq: Every day | TRANSDERMAL | Status: DC
  Administered 2015-03-21 – 2015-03-23 (×3): 21 mg via TRANSDERMAL
  Filled 2015-03-21 (×3): qty 1

## 2015-03-21 MED ORDER — CLINDAMYCIN PHOSPHATE 600 MG/50ML IV SOLN
600.0000 mg | Freq: Once | INTRAVENOUS | Status: AC
  Administered 2015-03-21: 600 mg via INTRAVENOUS
  Filled 2015-03-21: qty 50

## 2015-03-21 MED ORDER — SODIUM CHLORIDE 0.9 % IJ SOLN
3.0000 mL | Freq: Two times a day (BID) | INTRAMUSCULAR | Status: DC
  Administered 2015-03-22 (×2): 3 mL via INTRAVENOUS

## 2015-03-21 MED ORDER — ENOXAPARIN SODIUM 40 MG/0.4ML ~~LOC~~ SOLN
40.0000 mg | SUBCUTANEOUS | Status: DC
  Administered 2015-03-21 – 2015-03-22 (×2): 40 mg via SUBCUTANEOUS
  Filled 2015-03-21 (×2): qty 0.4

## 2015-03-21 MED ORDER — SODIUM CHLORIDE 0.9 % IJ SOLN: 3.0000 mL | INTRAMUSCULAR | Status: DC | PRN

## 2015-03-21 MED ORDER — ONDANSETRON HCL 4 MG/2ML IJ SOLN: 4.0000 mg | Freq: Four times a day (QID) | INTRAMUSCULAR | Status: DC | PRN

## 2015-03-21 MED ORDER — ACETAMINOPHEN 325 MG PO TABS: 650.0000 mg | ORAL_TABLET | Freq: Four times a day (QID) | ORAL | Status: DC | PRN

## 2015-03-21 MED ORDER — METHADONE HCL 10 MG PO TABS
125.0000 mg | ORAL_TABLET | Freq: Every day | ORAL | Status: DC
  Administered 2015-03-22 – 2015-03-23 (×2): 125 mg via ORAL
  Filled 2015-03-21 (×2): qty 13

## 2015-03-21 MED ORDER — VANCOMYCIN HCL IN DEXTROSE 1-5 GM/200ML-% IV SOLN
1000.0000 mg | Freq: Three times a day (TID) | INTRAVENOUS | Status: DC
  Administered 2015-03-21 – 2015-03-23 (×6): 1000 mg via INTRAVENOUS
  Filled 2015-03-21 (×10): qty 200

## 2015-03-21 MED ORDER — ONDANSETRON HCL 4 MG PO TABS: 4.0000 mg | ORAL_TABLET | Freq: Four times a day (QID) | ORAL | Status: DC | PRN

## 2015-03-21 MED ORDER — DULOXETINE HCL 60 MG PO CPEP
60.0000 mg | ORAL_CAPSULE | Freq: Every day | ORAL | Status: DC
  Administered 2015-03-21 – 2015-03-23 (×3): 60 mg via ORAL
  Filled 2015-03-21 (×3): qty 1

## 2015-03-21 MED ORDER — METHADONE HCL 5 MG PO TABS: 125.0000 mg | ORAL_TABLET | Freq: Every day | ORAL | Status: DC

## 2015-03-21 MED ORDER — PANTOPRAZOLE SODIUM 40 MG PO TBEC
40.0000 mg | DELAYED_RELEASE_TABLET | Freq: Every day | ORAL | Status: DC
  Administered 2015-03-22 – 2015-03-23 (×2): 40 mg via ORAL
  Filled 2015-03-21 (×2): qty 1

## 2015-03-21 MED ORDER — ACETAMINOPHEN 650 MG RE SUPP: 650.0000 mg | Freq: Four times a day (QID) | RECTAL | Status: DC | PRN

## 2015-03-21 NOTE — Consult Note (Signed)
PHARMACY CONSULT NOTE   Pharmacy Consult for :   Vancomycin  Indication:  Right Lower Leg Cellulitis  Hospital Problems: Principal Problem:   Cellulitis of right leg  Allergies: No Known Allergies  Patient Measurements: Height: 5\' 7"  (170.2 cm) Weight: 267 lb 12.8 oz (121.473 kg) IBW/kg (Calculated) : 61.6  Labs:  Recent Labs  03/21/15 1131  WBC 9.6  HGB 13.0  PLT 222  CREATININE 0.79   Estimated Creatinine Clearance: 132.6 mL/min (by C-G formula based on Cr of 0.79).  Microbiology: No results found for this or any previous visit (from the past 720 hour(s)).  Medical/Surgical History: Past Medical History  Diagnosis Date  . Migraine headache   . Herniated intervertebral disc   . Anxiety   . Depression    Past Surgical History  Procedure Laterality Date  . Tonsillectomy    . Cholecystectomy      Current Medication[s] Include: Prior to Admission: Medication Sig  . DULoxetine (CYMBALTA) 60 MG capsule Take 60 mg by mouth daily.  . methadone (DOLOPHINE) 10 MG/ML solution Take 125 mg by mouth daily.  Marland Kitchen. omeprazole (PRILOSEC) 40 MG capsule Take 40 mg by mouth daily.  . [DISCONTINUED] ALPRAZolam (XANAX) 1 MG tablet Take 1 tablet (1 mg total) by mouth 3 (three) times daily as needed for anxiety. (Patient not taking: Reported on 02/18/2015)  . [DISCONTINUED] doxycycline (VIBRAMYCIN) 100 MG capsule Take 1 capsule (100 mg total) by mouth 2 (two) times daily. (Patient not taking: Reported on 03/21/2015)  . [DISCONTINUED] furosemide (LASIX) 20 MG tablet Take 1 tablet (20 mg total) by mouth daily. (Patient not taking: Reported on 03/21/2015)  . [DISCONTINUED] loperamide (IMODIUM) 2 MG capsule Take 1 capsule (2 mg total) by mouth 4 (four) times daily as needed for diarrhea or loose stools. (Patient not taking: Reported on 02/18/2015)  . [DISCONTINUED] methocarbamol (ROBAXIN) 500 MG tablet Take 1 tablet (500 mg total) by mouth 2 (two) times daily. (Patient not taking: Reported on  02/18/2015)  . [DISCONTINUED] potassium chloride (K-DUR) 10 MEQ tablet Take 1 tablet (10 mEq total) by mouth daily. (Patient not taking: Reported on 03/21/2015)  . [DISCONTINUED] promethazine (PHENERGAN) 25 MG suppository Place 1 suppository (25 mg total) rectally every 6 (six) hours as needed for nausea. (Patient not taking: Reported on 02/18/2015)  . [DISCONTINUED] promethazine (PHENERGAN) 25 MG tablet Take 1 tablet (25 mg total) by mouth every 6 (six) hours as needed for nausea. (Patient not taking: Reported on 02/18/2015)   Scheduled:  Scheduled:  . DULoxetine  60 mg Oral Daily  . enoxaparin (LOVENOX) injection  40 mg Subcutaneous Q24H  . [START ON 03/22/2015] methadone  125 mg Oral Daily  . [START ON 03/22/2015] pantoprazole  40 mg Oral Daily  . sodium chloride  3 mL Intravenous Q12H   Antibiotic[s]: Anti-infectives    Start     Dose/Rate Route Frequency Ordered Stop   03/21/15 1215  clindamycin (CLEOCIN) IVPB 600 mg     600 mg 100 mL/hr over 30 Minutes Intravenous  Once 03/21/15 1206 03/21/15 1329      Assessment:  34 y/o female admitted with worsening presumed cellulitis of R-Lower Leg.  Duplex [-] for DVT.  Vancomycin per Pharmacy consult will be started.  Pt weight 121.5 kg, Est CrCl > 120,  Afebrile. WBC's 9.6.  Goal of Therapy:   Vancomycin trough level 10-15 mcg/ml  Plan:  1. Begin Vancomycin 1 gm IV q 8 hours. 2. Monitor renal function, WBC, fever curve, any cultures/sensitivities, Vancomycin  levels as clinically indicated, and clinical course.  Avalyn Molino, Elisha Headland,  Pharm.D,    5/22/20163:17 PM

## 2015-03-21 NOTE — H&P (Signed)
[facility] History and Physical                                                                                    Gina MangesMaggie Santos, is a 34 y.o. female  MRN: [id number]   DOB - 10/20/1981  Admit Date - 03/21/2015  [place] [facility] [place] [facility] [place]  Referring Physician:  Misty StanleyLisa, ER PA-C  Chief Complaint:   Chief Complaint  Patient presents with  . Leg Pain  . Leg Swelling     HPI  Gina MangesMaggie Santos  is a 34 y.o. female, with a past medical history significant for anxiety and depression, migraine headache. She is on methadone daily reportedly for chronic back pain. She presented to the emergency department for right lower extremity swelling and erythema. The patient reports that she has had multiple bug bites lately in her legs. Her right leg has been swollen for several weeks. She had a small patch of erythema that was just under her tattoo. Overnight the erythema spread in a stocking pattern over her left lower extremity from below the knee to just above the ankle. It is tender to touch and hot. The patient denies any systemic symptoms such as fever, vomiting, diarrhea, dizziness.  She denies any history of diabetes. She denies any intravenous drug use.  In the emergency department her labs are reassuring, and her lower sturdy Dopplers negative for DVT. Due to the rapid spread of erythema we decided to admit Gina Santos for observation and IV antibiotic therapy.  Review of Systems   In addition to the HPI above,  No Fever-chills, No Headache, No changes with Vision or hearing, No problems swallowing food or Liquids, No Chest pain, Cough or Shortness of Breath, No Abdominal pain, No Nausea or Vomiting, Bowel movements are regular, No Blood in stool or Urine, No dysuria, No new skin rashes or bruises, No new joints pains-aches,  No new weakness, tingling, numbness in any extremity, No recent weight gain or loss, A full 10 point Review of Systems was done, except as  stated above, all other Review of Systems were negative.  Past Medical History  Past Medical History  Diagnosis Date  . Migraine headache   . Herniated intervertebral disc   . Anxiety   . Depression     Past Surgical History  Procedure Laterality Date  . Tonsillectomy    . Cholecystectomy        Social History History  Substance Use Topics  . Smoking status: Current Every Day Smoker -- 1.00 packs/day    Types: Cigarettes  . Smokeless tobacco: Not on file  . Alcohol Use: No   she is independent with her ADLs. she is at home with her family and children.  Family History Her father died of cirrhosis in his 7730s, she had an uncle with diabetes and multiple nonhealing wounds.  Prior to Admission medications   Medication Sig Start Date End Date Taking? Authorizing Provider  DULoxetine (CYMBALTA) 60 MG capsule Take 60 mg by mouth daily.   Yes Historical Provider, MD  methadone (DOLOPHINE) 10 MG/ML solution Take 125 mg by mouth daily.   Yes Historical Provider,  MD  omeprazole (PRILOSEC) 40 MG capsule Take 40 mg by mouth daily.   Yes Historical Provider, MD  ALPRAZolam Prudy Feeler) 1 MG tablet Take 1 tablet (1 mg total) by mouth 3 (three) times daily as needed for anxiety. Patient not taking: Reported on 02/18/2015 08/09/13   Dahlia Client Muthersbaugh, PA-C  doxycycline (VIBRAMYCIN) 100 MG capsule Take 1 capsule (100 mg total) by mouth 2 (two) times daily. Patient not taking: Reported on 03/21/2015 02/18/15   Tilden Fossa, MD  furosemide (LASIX) 20 MG tablet Take 1 tablet (20 mg total) by mouth daily. Patient not taking: Reported on 03/21/2015 02/18/15   Tilden Fossa, MD  loperamide (IMODIUM) 2 MG capsule Take 1 capsule (2 mg total) by mouth 4 (four) times daily as needed for diarrhea or loose stools. Patient not taking: Reported on 02/18/2015 08/09/13   Dahlia Client Muthersbaugh, PA-C  methocarbamol (ROBAXIN) 500 MG tablet Take 1 tablet (500 mg total) by mouth 2 (two) times daily. Patient not  taking: Reported on 02/18/2015 08/09/13   Dahlia Client Muthersbaugh, PA-C  potassium chloride (K-DUR) 10 MEQ tablet Take 1 tablet (10 mEq total) by mouth daily. Patient not taking: Reported on 03/21/2015 02/18/15   Tilden Fossa, MD  promethazine (PHENERGAN) 25 MG suppository Place 1 suppository (25 mg total) rectally every 6 (six) hours as needed for nausea. Patient not taking: Reported on 02/18/2015 08/09/13   Dahlia Client Muthersbaugh, PA-C  promethazine (PHENERGAN) 25 MG tablet Take 1 tablet (25 mg total) by mouth every 6 (six) hours as needed for nausea. Patient not taking: Reported on 02/18/2015 08/09/13   Dahlia Client Muthersbaugh, PA-C    No Known Allergies  Physical Exam  Vitals  Blood pressure 115/61, pulse 78, temperature 97.9 F (36.6 C), temperature source Oral, resp. rate 18, height  (1.702 m), weight 121.473 kg (267 lb 12.8 oz), last menstrual period 03/03/2015, SpO2 100 %.   General:  Well-developed, overweight, pleasant female lying in bed in NAD, daughter at bedside  Psych:  Normal affect and insight, Not Suicidal or Homicidal, Awake Alert, Oriented X 3.  Neuro:   No F.N deficits, ALL C.Nerves Intact, Strength 5/5 all 4 extremities, Sensation intact all 4 extremities.  ENT:  Ears and Eyes appear Normal, Conjunctivae clear, PER. Moist oral mucosa without erythema or exudates.  Neck:  Supple, No lymphadenopathy appreciated  Respiratory:  Symmetrical chest wall movement, Good air movement bilaterally, CTAB.  Cardiac:  RRR, No Murmurs, no LE edema noted, no JVD.    Abdomen:  Positive bowel sounds, Soft, Non tender, Non distended,  No masses appreciated  Skin:  Multiple bug bites noted on legs. Right lower extremity is erythematous from below the knee to above the ankle, swollen and hot. It is tender to palpation. Left lower extremity has mild edema  Extremities:  Able to move all 4. 5/5 strength in each  Data   CBC  Recent Labs Lab 03/21/15 1131  WBC 9.6  HGB 13.0  HCT  38.5  PLT 222  MCV 89.1  MCH 30.1  MCHC 33.8  RDW 13.3  LYMPHSABS 2.4  MONOABS 0.6  EOSABS 0.2  BASOSABS 0.0    Chemistries   Recent Labs Lab 03/21/15 1131  NA 136  K 4.4  CL 103  CO2 24  GLUCOSE 109*  BUN 10  CREATININE 0.79  CALCIUM 9.2  AST 18  ALT 22  ALKPHOS 62  BILITOT 0.6    Imaging results:    No EKG performed   Assessment & Plan  Principal Problem:  Cellulitis of right leg Active Problems:   Abdominal pain, chronic, epigastric  Right lower extremity cellulitis Have traced the area. Given that she had leg swelling for several weeks to a month I wonder if there is some element of lymphedema. HIV pending. Hemoglobin A1c pending. Placed on vancomycin. Anticipate that she will be able to go home quickly on oral antibiotics.  Chronic back pain Will continue patient's home dose of methadone.  Tobacco abuse Nicotine patch.  Cessation counseled.    Consultants Called:  None  Family Communication:   Child at bedside. Patient is alert and oriented and understands her plan of care  Code Status:  Full code  Condition:  Stable  Potential Disposition: Potentially home 5/23  Time spent in minutes : [address],  PA-C on 03/21/2015 at 5:00 PM Between 7am to 7pm - Pager - [phone] After 7pm go to [email or web address] - password [email or web address] And look for the night coverage person covering me after hours  [facility]

## 2015-03-21 NOTE — ED Provider Notes (Signed)
CSN: [id number]     Arrival date & time 03/21/15  1115 History   First MD Initiated Contact with Patient 03/21/15 1119     Chief Complaint  Patient presents with  . Leg Pain  . Leg Swelling     (Consider location/radiation/quality/duration/timing/severity/associated sxs/prior Treatment) The history is provided by the patient and medical records.     This is a 34 year old female with past medical history significant for migraine headaches, herniated lumbar disc, anxiety, depression, presenting to the ED for right lower shotty swelling and erythema. She states she's been having intermittent issues with right leg swelling for the past month, but erythema developed over the past 24 hours. She states initially there is a small area of erythema and warmth to her right lateral calf, but has since progressed throughout her entire lower leg.  She denies any numbness or weakness of her right leg. She remains ambulatory without difficulty. She has no history of DVT, no recent travel or prolonged immobilization. She denies any fever or chills. She does admit to bug bites recently as she is currently staying with family and knows that there are bugs in the home. She denies any tick exposure. No headache, body aches, or rashes.  Patient is seen primarily at the [place].  Past Medical History  Diagnosis Date  . Migraine headache   . Herniated intervertebral disc   . Anxiety   . Depression    Past Surgical History  Procedure Laterality Date  . Tonsillectomy    . Cholecystectomy     No family history on file. History  Substance Use Topics  . Smoking status: Current Every Day Smoker -- 1.00 packs/day    Types: Cigarettes  . Smokeless tobacco: Not on file  . Alcohol Use: No   OB History    No data available     Review of Systems  Cardiovascular: Positive for leg swelling.  Skin: Positive for color change.  All other systems reviewed and are negative.     Allergies  Review of patient's  allergies indicates no known allergies.  Home Medications   Prior to Admission medications   Medication Sig Start Date End Date Taking? Authorizing Provider  DULoxetine (CYMBALTA) 60 MG capsule Take 60 mg by mouth daily.   Yes Historical Provider, MD  methadone (DOLOPHINE) 10 MG/ML solution Take 125 mg by mouth daily.   Yes Historical Provider, MD  omeprazole (PRILOSEC) 40 MG capsule Take 40 mg by mouth daily.   Yes Historical Provider, MD  ALPRAZolam Prudy Feeler) 1 MG tablet Take 1 tablet (1 mg total) by mouth 3 (three) times daily as needed for anxiety. Patient not taking: Reported on 02/18/2015 08/09/13   Dahlia Client Muthersbaugh, PA-C  doxycycline (VIBRAMYCIN) 100 MG capsule Take 1 capsule (100 mg total) by mouth 2 (two) times daily. Patient not taking: Reported on 03/21/2015 02/18/15   Tilden Fossa, MD  furosemide (LASIX) 20 MG tablet Take 1 tablet (20 mg total) by mouth daily. Patient not taking: Reported on 03/21/2015 02/18/15   Tilden Fossa, MD  loperamide (IMODIUM) 2 MG capsule Take 1 capsule (2 mg total) by mouth 4 (four) times daily as needed for diarrhea or loose stools. Patient not taking: Reported on 02/18/2015 08/09/13   Dahlia Client Muthersbaugh, PA-C  methocarbamol (ROBAXIN) 500 MG tablet Take 1 tablet (500 mg total) by mouth 2 (two) times daily. Patient not taking: Reported on 02/18/2015 08/09/13   Dahlia Client Muthersbaugh, PA-C  potassium chloride (K-DUR) 10 MEQ tablet Take 1 tablet (10 mEq total)  by mouth daily. Patient not taking: Reported on 03/21/2015 02/18/15   Tilden FossaElizabeth Rees, MD  promethazine (PHENERGAN) 25 MG suppository Place 1 suppository (25 mg total) rectally every 6 (six) hours as needed for nausea. Patient not taking: Reported on 02/18/2015 08/09/13   Dahlia ClientHannah Muthersbaugh, PA-C  promethazine (PHENERGAN) 25 MG tablet Take 1 tablet (25 mg total) by mouth every 6 (six) hours as needed for nausea. Patient not taking: Reported on 02/18/2015 08/09/13   Dahlia ClientHannah Muthersbaugh, PA-C   BP 98/52 mmHg   Pulse 75  Temp(Src) 98.2 F (36.8 C) (Oral)  Resp 18  Ht 5\' 7"  (1.702 m)  Wt 273 lb (123.832 kg)  BMI 42.75 kg/m2  SpO2 92%  LMP 03/03/2015 (Approximate)   Physical Exam  Constitutional: She is oriented to person, place, and time. She appears well-developed and well-nourished.  HENT:  Head: Normocephalic and atraumatic.  Mouth/Throat: Oropharynx is clear and moist.  Eyes: Conjunctivae and EOM are normal. Pupils are equal, round, and reactive to light.  Neck: Normal range of motion.  Cardiovascular: Normal rate, regular rhythm and normal heart sounds.   Pulmonary/Chest: Effort normal and breath sounds normal. No respiratory distress. She has no wheezes.  Abdominal: Soft. Bowel sounds are normal.  Musculoskeletal: Normal range of motion.  Right lower leg is erythematous, indurated, swollen, and warm to touch when compared with left; erythema is nearly circumferential but worse along anterior aspect; compartments are easily compressible, no tissue crepitus; no palpable cords; strong DP pulse and cap refill; full ROM of knee, ankle, foot, and all toes; normal sensation throughout right leg Tattoo of right lateral calf (old, been there several years); few scattered bug bites notes without signs of abscess formation  Neurological: She is alert and oriented to person, place, and time.  Skin: Skin is warm and dry.  Psychiatric: She has a normal mood and affect.  Nursing note and vitals reviewed.      ED Course  Procedures (including critical care time) Labs Review Labs Reviewed  COMPREHENSIVE METABOLIC PANEL - Abnormal; Notable for the following:    Glucose, Bld 109 (*)    All other components within normal limits  CBC WITH DIFFERENTIAL/PLATELET    Imaging Review No results found.  *Preliminary Results* Right lower extremity venous duplex completed. Study was technically limited due to edema. Visualized veins of the right lower extremity are negative for deep vein  thrombosis. There is no evidence of right Baker's cyst.  03/21/2015 12:22 PM  Gertie FeyMichelle Simonetti, RVT, RDCS, RDMS           EKG Interpretation None      MDM   Final diagnoses:  Cellulitis of right leg   34 year old female with rapidly progressing cellulitis of her right lower leg.  Cellulitis is nearly circumferential at this time, leg remains neurovascularly intact.  Compartments remain soft, easily compressible. There are no palpable cords or clinical signs of DVT and venous duplex is negative.  Patient given dose of IV clindamycin.  Labwork as above, no leukocytosis.  I have concern about how rapidly the cellulitis is progressing, feel patient would benefit from hospital admission and continued IV antibiotics, may need expanded coverage beyond clindamycin.  Case discussed with hospitalist service, will admit for further management.  Garlon HatchetLisa M Rithvik Orcutt, PA-C 03/21/15 1446  Richardean Canalavid H Yao, MD 03/22/15 [phone]

## 2015-03-21 NOTE — Progress Notes (Signed)
*  Preliminary Results* Right lower extremity venous duplex completed. Study was technically limited due to edema. Visualized veins of the right lower extremity are negative for deep vein thrombosis. There is no evidence of right Baker's cyst.  03/21/2015 12:22 PM  Gertie FeyMichelle Nasim Habeeb, RVT, RDCS, RDMS

## 2015-03-21 NOTE — ED Notes (Signed)
Pt c/o right leg swelling x 1 month with redness onset last night. Pt reports redness has spread since last night.

## 2015-03-22 DIAGNOSIS — R1013 Epigastric pain: Secondary | ICD-10-CM | POA: Diagnosis present

## 2015-03-22 DIAGNOSIS — Z6841 Body Mass Index (BMI) 40.0 and over, adult: Secondary | ICD-10-CM | POA: Diagnosis not present

## 2015-03-22 DIAGNOSIS — Z79891 Long term (current) use of opiate analgesic: Secondary | ICD-10-CM | POA: Diagnosis not present

## 2015-03-22 DIAGNOSIS — G43909 Migraine, unspecified, not intractable, without status migrainosus: Secondary | ICD-10-CM | POA: Diagnosis present

## 2015-03-22 DIAGNOSIS — F418 Other specified anxiety disorders: Secondary | ICD-10-CM | POA: Diagnosis not present

## 2015-03-22 DIAGNOSIS — F329 Major depressive disorder, single episode, unspecified: Secondary | ICD-10-CM | POA: Diagnosis present

## 2015-03-22 DIAGNOSIS — F419 Anxiety disorder, unspecified: Secondary | ICD-10-CM | POA: Diagnosis present

## 2015-03-22 DIAGNOSIS — L03115 Cellulitis of right lower limb: Secondary | ICD-10-CM | POA: Diagnosis present

## 2015-03-22 DIAGNOSIS — Z833 Family history of diabetes mellitus: Secondary | ICD-10-CM | POA: Diagnosis not present

## 2015-03-22 DIAGNOSIS — M549 Dorsalgia, unspecified: Secondary | ICD-10-CM | POA: Diagnosis present

## 2015-03-22 DIAGNOSIS — F17210 Nicotine dependence, cigarettes, uncomplicated: Secondary | ICD-10-CM | POA: Diagnosis present

## 2015-03-22 DIAGNOSIS — G8929 Other chronic pain: Secondary | ICD-10-CM | POA: Diagnosis present

## 2015-03-22 LAB — CBC
HCT: 36 % (ref 36.0–46.0)
Hemoglobin: 12.6 g/dL (ref 12.0–15.0)
MCH: 30.9 pg (ref 26.0–34.0)
MCHC: 35 g/dL (ref 30.0–36.0)
MCV: 88.2 fL (ref 78.0–100.0)
PLATELETS: 205 10*3/uL (ref 150–400)
RBC: 4.08 MIL/uL (ref 3.87–5.11)
RDW: 13.4 % (ref 11.5–15.5)
WBC: 8.2 10*3/uL (ref 4.0–10.5)

## 2015-03-22 LAB — BASIC METABOLIC PANEL
Anion gap: 8 (ref 5–15)
BUN: 9 mg/dL (ref 6–20)
CO2: 24 mmol/L (ref 22–32)
Calcium: 8.5 mg/dL — ABNORMAL LOW (ref 8.9–10.3)
Chloride: 105 mmol/L (ref 101–111)
Creatinine, Ser: 0.76 mg/dL (ref 0.44–1.00)
GFR calc non Af Amer: >60 mL/min (ref >60)
GLUCOSE: 85 mg/dL (ref 65–99)
POTASSIUM: 4 mmol/L (ref 3.5–5.1)
Sodium: 137 mmol/L (ref 135–145)

## 2015-03-22 LAB — HIV ANTIBODY (ROUTINE TESTING W REFLEX): HIV SCREEN 4TH GENERATION: NONREACTIVE

## 2015-03-22 LAB — HEMOGLOBIN A1C
Hgb A1c MFr Bld: 5.7 % — ABNORMAL HIGH (ref 4.8–5.6)
MEAN PLASMA GLUCOSE: 117 mg/dL

## 2015-03-22 NOTE — Progress Notes (Addendum)
Patient ID: Gina Santos, female   DOB: 03-02-1981, 34 y.o.   MRN: [id number]  [facility] PROGRESS NOTE  Gina Santos WUJ:[id number] DOB: [birth year] DOA: 03/21/2015 PCP: Pcp Not In System   Brief narrative:    34 y.o. female, with known anxiety and depression, migraine headache, on methadone daily reportedly for chronic back pain, presented to [facility] ED with main concern of several days duration of progressively worsening RLE swelling and erythema, associated with throbbing and constant pain, 7/10 in severity with no specific alleviating factors and worse with ambulation. This has progressed to involve LLE.  In the ED, LE Dopplers negative for DVT. Pt started on IV Vancomycin    Assessment/Plan:    Principal Problem:   Cellulitis of right leg - significant improvement based on marked lining  - continue Vancomycin day #2/10, will continue IV ABX today and transition to PO in AM (possibly to clindamycin or bactrim)  - if pt continues to improve, plan d/c home in AM - ambulate if pt able to tolerate  Active Problems:   Chronic back pain - continue analgesia as per home medical regimen   Morbid obesity  - Body mass index is 41.93 kg/(m^2)   DVT prophylaxis  - Lovenox SQ   Code Status: Full.  Family Communication:  plan of care discussed with the patient Disposition Plan: Home in AM if cellulitis improving   IV access:  Peripheral IV  Procedures and diagnostic studies:    No results found.  Medical Consultants:  None   Other Consultants:  None  IAnti-Infectives:   Vancomycin 5/22 -->  Debbora Presto, MD  [facility] Pager [phone]  If 7PM-7AM, please contact night-coverage [email or web address] Password [email or web address] 03/22/2015, 1:31 PM  HPI/Subjective: No events overnight.   Objective: Filed Vitals:   03/21/15 1458 03/21/15 2006 03/22/15 0537 03/22/15 1300  BP: 115/61 114/42 108/40 115/40  Pulse: 78 76 83 76  Temp: 97.9 F (36.6 C) 98.6 F (37 C) 97.5 F (36.4 C) 98.2 F  (36.8 C)  TempSrc: Oral Oral Oral Oral  Resp: Height:  (1.702 m)     Weight: 121.473 kg (267 lb 12.8 oz)     SpO2: 100% 100% 100%     Intake/Output Summary (Last 24 hours) at 03/22/15 1331 Last data filed at 03/22/15 0808  Gross per 24 hour  Intake   1960 ml  Output      0 ml  Net   1960 ml    Exam:   General:  Pt is alert, follows commands appropriately, not in acute distress  Cardiovascular: Regular rate and rhythm, S1/S2, no murmurs, no rubs, no gallops  Respiratory: Clear to auscultation bilaterally, no wheezing, no crackles, no rhonchi  Abdomen: Soft, non tender, non distended, bowel sounds present, no guarding  Extremities: RLE erythema with mild edema and warmth to touch, from ankle to mid anterior shin area  Neuro: Grossly nonfocal  Data Reviewed: Basic Metabolic Panel:  Recent Labs Lab 03/21/15 1131 03/22/15 0425  NA 136 137  K 4.4 4.0  CL 103 105  CO2 24 24  GLUCOSE 109* 85  BUN 10 9  CREATININE 0.79 0.76  CALCIUM 9.2 8.5*   Liver Function Tests:  Recent Labs Lab 03/21/15 1131  AST 18  ALT 22  ALKPHOS 62  BILITOT 0.6  PROT 7.0  ALBUMIN 3.7   CBC:  Recent Labs Lab 03/21/15 1131 03/22/15 0425  WBC 9.6 8.2  NEUTROABS 6.3  --   HGB  13.0 12.6  HCT 38.5 36.0  MCV 89.1 88.2  PLT 222 205   Scheduled Meds: . DULoxetine  60 mg Oral Daily  . enoxaparin (LOVENOX) injection  40 mg Subcutaneous Q24H  . methadone  125 mg Oral Daily  . nicotine  21 mg Transdermal Daily  . pantoprazole  40 mg Oral Daily  . sodium chloride  3 mL Intravenous Q12H  . vancomycin  1,000 mg Intravenous Q8H   Continuous Infusions:

## 2015-03-23 DIAGNOSIS — G8929 Other chronic pain: Secondary | ICD-10-CM

## 2015-03-23 DIAGNOSIS — L03115 Cellulitis of right lower limb: Principal | ICD-10-CM

## 2015-03-23 DIAGNOSIS — M549 Dorsalgia, unspecified: Secondary | ICD-10-CM

## 2015-03-23 DIAGNOSIS — F418 Other specified anxiety disorders: Secondary | ICD-10-CM

## 2015-03-23 LAB — CBC
HEMATOCRIT: 37.6 % (ref 36.0–46.0)
Hemoglobin: 13 g/dL (ref 12.0–15.0)
MCH: 30.4 pg (ref 26.0–34.0)
MCHC: 34.6 g/dL (ref 30.0–36.0)
MCV: 88.1 fL (ref 78.0–100.0)
Platelets: UNDETERMINED 10*3/uL (ref 150–400)
RBC: 4.27 MIL/uL (ref 3.87–5.11)
RDW: 13.3 % (ref 11.5–15.5)
WBC: 8.4 10*3/uL (ref 4.0–10.5)

## 2015-03-23 LAB — BASIC METABOLIC PANEL
Anion gap: 7 (ref 5–15)
BUN: 9 mg/dL (ref 6–20)
CO2: 24 mmol/L (ref 22–32)
Calcium: 8.9 mg/dL (ref 8.9–10.3)
Chloride: 105 mmol/L (ref 101–111)
Creatinine, Ser: 0.79 mg/dL (ref 0.44–1.00)
GFR calc Af Amer: >60 mL/min (ref >60)
Glucose, Bld: 85 mg/dL (ref 65–99)
POTASSIUM: 4.4 mmol/L (ref 3.5–5.1)
Sodium: 136 mmol/L (ref 135–145)

## 2015-03-23 MED ORDER — DOXYCYCLINE HYCLATE 100 MG PO TABS: 100.0000 mg | ORAL_TABLET | Freq: Two times a day (BID) | ORAL | Status: DC

## 2015-03-23 MED ORDER — ACETAMINOPHEN 325 MG PO TABS: 650.0000 mg | ORAL_TABLET | Freq: Four times a day (QID) | ORAL | Status: DC | PRN

## 2015-03-23 NOTE — Progress Notes (Signed)
Claude MangesMaggie Cohick discharged  per MD order. Discharge instructions reviewed and discussed with patient. All questions and concerns answered. Copy of instructions and scripts given to patient. IV removed.  Patient escorted to car by staff in a wheelchair. No distress noted upon discharge.   Lorin PicketScott, [place]any R 03/23/2015 1:17 PM

## 2015-03-23 NOTE — Discharge Instructions (Signed)

## 2015-03-23 NOTE — Discharge Summary (Addendum)
Physician Discharge Summary  Claude MangesMaggie Bernick AVW:[id number] DOB: 06/12/1981 DOA: 03/21/2015  PCP: [facility] [place] [facility] [place] [facility]   Admit date: 03/21/2015 Discharge date: 03/23/2015  Recommendations for Outpatient Follow-up:  1. Continue doxycycline for 10 days on discharge for treatment of cellulitis.  Discharge Diagnoses:  Principal Problem:   Cellulitis of right leg Active Problems:   Abdominal pain, chronic, epigastric   Tobacco abuse    Discharge Condition: stable   Diet recommendation: as tolerated   History of present illness:  34 y.o. female, with known anxiety and depression, migraine headache, on methadone daily for chronic back pain who [facility] with progressively worsening RLE swelling and erythema associated with throbbing and constant pain, 7/10 in severity with no specific alleviating factors and worse with ambulation.  In the ED, LE Dopplers were negative for DVT. Pt was started on IV Vancomycin   Hospital Course:   Assessment/Plan:    Principal Problem:  Cellulitis of right leg - Cellulitis improving while on vancomycin. - Patient is medically stable for discharge today. She will continue taking doxycycline for 10 days on discharge.  Active Problems:  Chronic back pain - continue analgesia as per home medical regimen   Morbid obesity  - Body mass index is 41.93 kg/(m^2) - Counseled on diet    Anxiety and depression - Stable. Continue Cymbalta.   DVT prophylaxis  - Lovenox SQ while patient in hospital  Code Status: Full.  Family Communication: plan of care discussed with the patient   IV access:  Peripheral IV  Medical Consultants:  None   Other Consultants:  None  IAnti-Infectives:   Vancomycin 5/22 --> 03/23/2015     Signed:  Manson PasseyEVINE, Heliodoro Domagalski, MD  [facility] 03/23/2015, 9:58 AM  Pager #: [phone]  Time spent in minutes: less than 30 minutes   Discharge Exam: Filed Vitals:   03/23/15 0446   BP: 104/56  Pulse: 76  Temp: 98.7 F (37.1 C)  Resp: 18   Filed Vitals:   03/22/15 1300 03/22/15 1358 03/22/15 2017 03/23/15 0446  BP: 115/40 93/66 105/57 104/56  Pulse: 76 101 82 76  Temp: 98.2 F (36.8 C)  98.8 F (37.1 C) 98.7 F (37.1 C)  TempSrc: Oral Axillary Oral Oral  Resp: 18 20 19 18   Height:      Weight:      SpO2: 100% 100% 97% 98%    General: Pt is alert, follows commands appropriately, not in acute distress Cardiovascular: Regular rate and rhythm, S1/S2 +, no murmurs Respiratory: Clear to auscultation bilaterally, no wheezing, no crackles, no rhonchi Abdominal: Soft, non tender, non distended, bowel sounds +, no guarding Extremities: right LE extremity redness improving, no cyanosis, pulses palpable bilaterally DP and PT Neuro: Grossly nonfocal  Discharge Instructions  Discharge Instructions    Call MD for:  difficulty breathing, headache or visual disturbances    Complete by:  As directed      Call MD for:  persistant nausea and vomiting    Complete by:  As directed      Call MD for:  severe uncontrolled pain    Complete by:  As directed      Diet - low sodium heart healthy    Complete by:  As directed      Discharge instructions    Complete by:  As directed   1. Continue doxycycline for 10 days on discharge for treatment of cellulitis.     Increase activity slowly    Complete by:  As directed             Medication List    TAKE these medications        acetaminophen 325 MG tablet  Commonly known as:  TYLENOL  Take 2 tablets (650 mg total) by mouth every 6 (six) hours as needed for mild pain (or Fever >/= 101).     doxycycline 100 MG tablet  Commonly known as:  VIBRA-TABS  Take 1 tablet (100 mg total) by mouth 2 (two) times daily.     DULoxetine 60 MG capsule  Commonly known as:  CYMBALTA  Take 60 mg by mouth daily.     methadone 10 MG/ML solution  Commonly known as:  DOLOPHINE  Take 125 mg by mouth daily.     omeprazole 40 MG  capsule  Commonly known as:  PRILOSEC  Take 40 mg by mouth daily.          The results of significant diagnostics from this hospitalization (including imaging, microbiology, ancillary and laboratory) are listed below for reference.    Significant Diagnostic Studies: No results found.  Microbiology: No results found for this or any previous visit (from the past 240 hour(s)).   Labs: Basic Metabolic Panel:  Recent Labs Lab 03/21/15 1131 03/22/15 0425 03/23/15 0550  NA 136 137 136  K 4.4 4.0 4.4  CL 103 105 105  CO2 GLUCOSE 109* 85 85  BUN CREATININE 0.79 0.76 0.79  CALCIUM 9.2 8.5* 8.9   Liver Function Tests:  Recent Labs Lab 03/21/15 1131  AST 18  ALT 22  ALKPHOS 62  BILITOT 0.6  PROT 7.0  ALBUMIN 3.7   No results for input(s): LIPASE, AMYLASE in the last 168 hours. No results for input(s): AMMONIA in the last 168 hours. CBC:  Recent Labs Lab 03/21/15 1131 03/22/15 0425 03/23/15 0550  WBC 9.6 8.2 8.4  NEUTROABS 6.3  --   --   HGB 13.0 12.6 13.0  HCT 38.5 36.0 37.6  MCV 89.1 88.2 88.1  PLT 222 205 PLATELET CLUMPS NOTED ON SMEAR, UNABLE TO ESTIMATE   Cardiac Enzymes: No results for input(s): CKTOTAL, CKMB, CKMBINDEX, TROPONINI in the last 168 hours. BNP: BNP (last 3 results)  Recent Labs  02/18/15 1852  BNP 35.1    ProBNP (last 3 results) No results for input(s): PROBNP in the last 8760 hours.  CBG: No results for input(s): GLUCAP in the last 168 hours.

## 2015-11-18 ENCOUNTER — Other Ambulatory Visit (HOSPITAL_COMMUNITY)
Admission: RE | Admit: 2015-11-18 | Discharge: 2015-11-18 | Disposition: A | Discharge status: Home / Self Care | Payer: Non-veteran care | Source: Ambulatory Visit | Attending: Family Medicine | Admitting: Family Medicine

## 2015-11-18 ENCOUNTER — Other Ambulatory Visit (HOSPITAL_COMMUNITY): Admission: AD | Admit: 2015-11-18 | Payer: Self-pay | Source: Ambulatory Visit | Admitting: Family Medicine

## 2015-11-18 ENCOUNTER — Emergency Department (INDEPENDENT_AMBULATORY_CARE_PROVIDER_SITE_OTHER)
Admission: EM | Admit: 2015-11-18 | Discharge: 2015-11-18 | Disposition: A | Discharge status: Home / Self Care | Payer: Medicaid Other | Source: Home / Self Care | Attending: Family Medicine | Admitting: Family Medicine

## 2015-11-18 ENCOUNTER — Encounter (HOSPITAL_COMMUNITY): Payer: Self-pay | Admitting: Emergency Medicine

## 2015-11-18 DIAGNOSIS — L02412 Cutaneous abscess of left axilla: Secondary | ICD-10-CM

## 2015-11-18 MED ORDER — LIDOCAINE HCL 2 % IJ SOLN
INTRAMUSCULAR | Status: AC
Start: 2015-11-18 — End: 2015-11-18
  Filled 2015-11-18: qty 20

## 2015-11-18 MED ORDER — LIDOCAINE-EPINEPHRINE (PF) 2 %-1:200000 IJ SOLN
INTRAMUSCULAR | Status: AC
  Filled 2015-11-18: qty 20

## 2015-11-18 MED ORDER — MUPIROCIN CALCIUM 2 % NA OINT: TOPICAL_OINTMENT | NASAL | Status: DC

## 2015-11-18 NOTE — ED Provider Notes (Signed)
CSN: [id number]     Arrival date & time 11/18/15  1301 History   First MD Initiated Contact with Patient 11/18/15 1314     Chief Complaint  Patient presents with  . Abscess   (Consider location/radiation/quality/duration/timing/severity/associated sxs/prior Treatment) HPI Comments: 35 year old obese female developed a small red bump to the left axilla approximately 7 days ago. In the ensuing days it is continuing to increase in size and has developed into an abscess. She presents today with an axillary abscess, exquisitely tender and erythematous and fluctuant. She states she has had smaller infectious bumps in the axilla in the past few months but they have resolved without intervention. This particular one has not. She also has family members that have had small infectious papules or pustules recently.   Past Medical History  Diagnosis Date  . Migraine headache   . Herniated intervertebral disc   . Anxiety   . Depression    Past Surgical History  Procedure Laterality Date  . Tonsillectomy    . Cholecystectomy     Family History  Problem Relation Age of Onset  . Cirrhosis Father     Died in his 26s  . Diabetes Maternal Uncle    Social History  Substance Use Topics  . Smoking status: Current Every Day Smoker -- 1.00 packs/day    Types: Cigarettes  . Smokeless tobacco: None  . Alcohol Use: No   OB History    No data available     Review of Systems  Constitutional: Negative.  Negative for fever.  HENT: Negative.   Respiratory: Negative.   Skin: Positive for wound.  Neurological: Negative.     Allergies  Review of patient's allergies indicates no known allergies.  Home Medications   Prior to Admission medications   Medication Sig Start Date End Date Taking? Authorizing Provider  acetaminophen (TYLENOL) 325 MG tablet Take 2 tablets (650 mg total) by mouth every 6 (six) hours as needed for mild pain (or Fever >/= 101). 03/23/15   Alison Murray, MD  doxycycline  (VIBRA-TABS) 100 MG tablet Take 1 tablet (100 mg total) by mouth 2 (two) times daily. Patient not taking: Reported on 11/18/2015 03/23/15   Alison Murray, MD  DULoxetine (CYMBALTA) 60 MG capsule Take 60 mg by mouth daily.    Historical Provider, MD  methadone (DOLOPHINE) 10 MG/ML solution Take 125 mg by mouth daily.    Historical Provider, MD  mupirocin nasal ointment (BACTROBAN) 2 % Apply in each nostril daily 11/18/15   Hayden Rasmussen, NP  omeprazole (PRILOSEC) 40 MG capsule Take 40 mg by mouth daily.    Historical Provider, MD   Meds Ordered and Administered this Visit  Medications - No data to display  BP 106/68 mmHg  Pulse 96  Temp(Src) 97.8 F (36.6 C) (Oral)  Resp 16  SpO2 96%  LMP 11/14/2015 No data found.   Physical Exam  Constitutional: She is oriented to person, place, and time. She appears well-developed and well-nourished. No distress.  Eyes: EOM are normal.  Neck: Normal range of motion. Neck supple.  Cardiovascular: Normal rate.   Pulmonary/Chest: Effort normal. No respiratory distress.  Musculoskeletal: She exhibits no edema.  Neurological: She is alert and oriented to person, place, and time. She exhibits normal muscle tone.  Skin: Skin is warm and dry.  There is an elongated fluctuant, tender, erythematous lesion along the axillary lines within the fossa. It measures approximately 4-1/2-5 cm in length and 2-1/2 cm in width. Underlying induration. The  surrounding dermis is without erythema. No apparent extending cellulitis.  Psychiatric: She has a normal mood and affect.  Nursing note and vitals reviewed.   ED Course  .Marland KitchenIncision and Drainage Date/Time: 11/18/2015 2:19 PM Performed by: Phineas Real, Liba Hulsey Authorized by: Bradd Canary D Consent: Verbal consent obtained. Risks and benefits: risks, benefits and alternatives were discussed Consent given by: patient Patient understanding: patient states understanding of the procedure being performed Patient identity confirmed:  verbally with patient Type: abscess Body area: upper extremity Anesthesia: local infiltration Local anesthetic: lidocaine 2% without epinephrine Anesthetic total: 7 ml Patient sedated: no Scalpel size: 11 Incision type: single with marsupialization Incision depth: subcutaneous Complexity: simple Drainage: purulent and  bloody Drainage amount: moderate Wound treatment: drain placed Packing material: 1/4 in iodoform gauze Patient tolerance: Patient tolerated the procedure well with no immediate complications   (including critical care time)  Labs Review Labs Reviewed  CULTURE, ROUTINE-ABSCESS    Imaging Review No results found.   Visual Acuity Review  Right Eye Distance:   Left Eye Distance:   Bilateral Distance:    Right Eye Near:   Left Eye Near:    Bilateral Near:         MDM   1. Abscess of axilla, left    Meds ordered this encounter  Medications  . mupirocin nasal ointment (BACTROBAN) 2 %    Sig: Apply in each nostril daily    Dispense:  1 g    Refill:  0    Order Specific Question:  Supervising Provider    Answer:  Linna Hoff [[id number]]     Keep clean and dry Use bactoban nasal ointment daily Return 2 days for wound chk   . Warm compresses.  Hayden Rasmussen, NP 11/18/15 1426

## 2015-11-18 NOTE — ED Notes (Signed)
Abscess under left axilla.  Noticed 7 days

## 2015-11-18 NOTE — Discharge Instructions (Signed)
Abscess Warm compresses, Keep dry, Return 2 days for wound check and packing removal. An abscess is an infected area that contains a collection of pus and debris.It can occur in almost any part of the body. An abscess is also known as a furuncle or boil. CAUSES  An abscess occurs when tissue gets infected. This can occur from blockage of oil or sweat glands, infection of hair follicles, or a minor injury to the skin. As the body tries to fight the infection, pus collects in the area and creates pressure under the skin. This pressure causes pain. People with weakened immune systems have difficulty fighting infections and get certain abscesses more often.  SYMPTOMS Usually an abscess develops on the skin and becomes a painful mass that is red, warm, and tender. If the abscess forms under the skin, you may feel a moveable soft area under the skin. Some abscesses break open (rupture) on their own, but most will continue to get worse without care. The infection can spread deeper into the body and eventually into the bloodstream, causing you to feel ill.  DIAGNOSIS  Your caregiver will take your medical history and perform a physical exam. A sample of fluid may also be taken from the abscess to determine what is causing your infection. TREATMENT  Your caregiver may prescribe antibiotic medicines to fight the infection. However, taking antibiotics alone usually does not cure an abscess. Your caregiver may need to make a small cut (incision) in the abscess to drain the pus. In some cases, gauze is packed into the abscess to reduce pain and to continue draining the area. HOME CARE INSTRUCTIONS   Only take over-the-counter or prescription medicines for pain, discomfort, or fever as directed by your caregiver.  If you were prescribed antibiotics, take them as directed. Finish them even if you start to feel better.  If gauze is used, follow your caregiver's directions for changing the gauze.  To avoid  spreading the infection:  Keep your draining abscess covered with a bandage.  Wash your hands well.  Do not share personal care items, towels, or whirlpools with others.  Avoid skin contact with others.  Keep your skin and clothes clean around the abscess.  Keep all follow-up appointments as directed by your caregiver. SEEK MEDICAL CARE IF:   You have increased pain, swelling, redness, fluid drainage, or bleeding.  You have muscle aches, chills, or a general ill feeling.  You have a fever. MAKE SURE YOU:   Understand these instructions.  Will watch your condition.  Will get help right away if you are not doing well or get worse.   This information is not intended to replace advice given to you by your health care provider. Make sure you discuss any questions you have with your health care provider.   Document Released: 07/26/2005 Document Revised: 04/16/2012 Document Reviewed: 12/29/2011 Elsevier Interactive Patient Education 2016 Elsevier Inc.  Incision and Drainage Incision and drainage is a procedure in which a sac-like structure (cystic structure) is opened and drained. The area to be drained usually contains material such as pus, fluid, or blood.  LET YOUR CAREGIVER KNOW ABOUT:   Allergies to medicine.  Medicines taken, including vitamins, herbs, eyedrops, over-the-counter medicines, and creams.  Use of steroids (by mouth or creams).  Previous problems with anesthetics or numbing medicines.  History of bleeding problems or blood clots.  Previous surgery.  Other health problems, including diabetes and kidney problems.  Possibility of pregnancy, if this applies. RISKS AND  COMPLICATIONS  Pain.  Bleeding.  Scarring.  Infection. BEFORE THE PROCEDURE  You may need to have an ultrasound or other imaging tests to see how large or deep your cystic structure is. Blood tests may also be used to determine if you have an infection or how severe the infection is.  You may need to have a tetanus shot. PROCEDURE  The affected area is cleaned with a cleaning fluid. The cyst area will then be numbed with a medicine (local anesthetic). A small incision will be made in the cystic structure. A syringe or catheter may be used to drain the contents of the cystic structure, or the contents may be squeezed out. The area will then be flushed with a cleansing solution. After cleansing the area, it is often gently packed with a gauze or another wound dressing. Once it is packed, it will be covered with gauze and tape or some other type of wound dressing. AFTER THE PROCEDURE   Often, you will be allowed to go home right after the procedure.  You may be given antibiotic medicine to prevent or heal an infection.  If the area was packed with gauze or some other wound dressing, you will likely need to come back in 1 to 2 days to get it removed.  The area should heal in about 14 days.   This information is not intended to replace advice given to you by your health care provider. Make sure you discuss any questions you have with your health care provider.   Document Released: 04/11/2001 Document Revised: 04/16/2012 Document Reviewed: 12/11/2011 Elsevier Interactive Patient Education [employer]

## 2015-11-21 ENCOUNTER — Encounter (HOSPITAL_COMMUNITY): Payer: Self-pay | Admitting: *Deleted

## 2015-11-21 ENCOUNTER — Emergency Department (HOSPITAL_COMMUNITY)
Admission: EM | Admit: 2015-11-21 | Discharge: 2015-11-21 | Disposition: A | Discharge status: Home / Self Care | Payer: Non-veteran care | Attending: Emergency Medicine | Admitting: Emergency Medicine

## 2015-11-21 DIAGNOSIS — Z8679 Personal history of other diseases of the circulatory system: Secondary | ICD-10-CM | POA: Insufficient documentation

## 2015-11-21 DIAGNOSIS — L0291 Cutaneous abscess, unspecified: Secondary | ICD-10-CM

## 2015-11-21 DIAGNOSIS — Z8659 Personal history of other mental and behavioral disorders: Secondary | ICD-10-CM | POA: Diagnosis not present

## 2015-11-21 DIAGNOSIS — F17210 Nicotine dependence, cigarettes, uncomplicated: Secondary | ICD-10-CM | POA: Diagnosis not present

## 2015-11-21 DIAGNOSIS — L02412 Cutaneous abscess of left axilla: Secondary | ICD-10-CM | POA: Insufficient documentation

## 2015-11-21 DIAGNOSIS — Z8739 Personal history of other diseases of the musculoskeletal system and connective tissue: Secondary | ICD-10-CM | POA: Insufficient documentation

## 2015-11-21 DIAGNOSIS — Z79891 Long term (current) use of opiate analgesic: Secondary | ICD-10-CM | POA: Insufficient documentation

## 2015-11-21 DIAGNOSIS — Z79899 Other long term (current) drug therapy: Secondary | ICD-10-CM | POA: Diagnosis not present

## 2015-11-21 DIAGNOSIS — Z4801 Encounter for change or removal of surgical wound dressing: Secondary | ICD-10-CM | POA: Diagnosis present

## 2015-11-21 LAB — CULTURE, ROUTINE-ABSCESS

## 2015-11-21 MED ORDER — SULFAMETHOXAZOLE-TRIMETHOPRIM 800-160 MG PO TABS: 1.0000 | ORAL_TABLET | Freq: Two times a day (BID) | ORAL | Status: AC

## 2015-11-21 MED ORDER — HYDROCODONE-ACETAMINOPHEN 5-325 MG PO TABS
1.0000 | ORAL_TABLET | Freq: Once | ORAL | Status: AC
  Administered 2015-11-21: 1 via ORAL
  Filled 2015-11-21: qty 1

## 2015-11-21 NOTE — Discharge Instructions (Signed)
Take your medications as prescribed. Keep wound clean using antibacterial soap and water and pat dry. You may take 600 mg of ibuprofen 4 times daily as needed for pain relief. Follow-up with your primary care provider or return to the emergency department in 2 days for wound recheck/packing removal. Return to the emergency department if symptoms worsen or new onset of fever, redness, swelling, warmth, drainage, numbness, tingling.

## 2015-11-21 NOTE — ED Notes (Signed)
Pt presents for wound check. Pt has packing placed in opened wound that was placed at [facility] .

## 2015-11-21 NOTE — ED Notes (Signed)
Declined W/C at D/C and was escorted to lobby by RN. 

## 2015-11-21 NOTE — ED Provider Notes (Signed)
CSN: [id number]     Arrival date & time 11/21/15  1026 History  By signing my name below, I, [name], attest that this documentation has been prepared under the direction and in the presence of Melburn Hake, PA-C. Electronically Signed: [name], ED Scribe. 11/21/2015. 11:02 AM.  Chief Complaint  Patient presents with  . Wound Check   The history is provided by the patient. No language interpreter was used.   HPI Comments: Gina Santos is a 35 y.o. female who presents to the Emergency Department for a wound check today. Pt reports she has a abscess under her left axilla that was lanced and packed 3 days ago at urgent care. Pt has had dark white pus drainage and pain since. Pt denies any size changes since the abscess was lanced. Pt has been keeping the wound clean. Pt has been taking Aspirin for pain. Pt denies any fevers or surround redness. Pt denies taking any oral abx or using abx ointment.  NKA. Denies fever, warmth, swelling, numbness, tingling.  Past Medical History  Diagnosis Date  . Migraine headache   . Herniated intervertebral disc   . Anxiety   . Depression    Past Surgical History  Procedure Laterality Date  . Tonsillectomy    . Cholecystectomy     Family History  Problem Relation Age of Onset  . Cirrhosis Father     Died in his 70s  . Diabetes Maternal Uncle    Social History  Substance Use Topics  . Smoking status: Current Every Day Smoker -- 1.00 packs/day    Types: Cigarettes  . Smokeless tobacco: None  . Alcohol Use: No   OB History    No data available     Review of Systems  Constitutional: Negative for fever.  Skin: Positive for wound (right axilla). Negative for color change.      Allergies  Review of patient's allergies indicates no known allergies.  Home Medications   Prior to Admission medications   Medication Sig Start Date End Date Taking? Authorizing Provider  acetaminophen (TYLENOL) 325 MG tablet Take 2 tablets (650 mg  total) by mouth every 6 (six) hours as needed for mild pain (or Fever >/= 101). 03/23/15   Alison Murray, MD  doxycycline (VIBRA-TABS) 100 MG tablet Take 1 tablet (100 mg total) by mouth 2 (two) times daily. Patient not taking: Reported on 11/18/2015 03/23/15   Alison Murray, MD  DULoxetine (CYMBALTA) 60 MG capsule Take 60 mg by mouth daily.    Historical Provider, MD  methadone (DOLOPHINE) 10 MG/ML solution Take 125 mg by mouth daily.    Historical Provider, MD  mupirocin nasal ointment (BACTROBAN) 2 % Apply in each nostril daily 11/18/15   Hayden Rasmussen, NP  omeprazole (PRILOSEC) 40 MG capsule Take 40 mg by mouth daily.    Historical Provider, MD  sulfamethoxazole-trimethoprim (BACTRIM DS,SEPTRA DS) 800-160 MG tablet Take 1 tablet by mouth 2 (two) times daily. 11/21/15 11/28/15  Satira Sark Rodrigues [place], PA-C   BP 119/75 mmHg  Pulse 89  Temp(Src) 98.2 F (36.8 C) (Oral)  Resp 18  Ht  (1.702 m)  Wt 119.75 kg  BMI 41.34 kg/m2  SpO2 99%  LMP 11/14/2015 Physical Exam  Constitutional: She is oriented to person, place, and time. She appears well-developed and well-nourished.  HENT:  Head: Normocephalic and atraumatic.  Eyes: Conjunctivae and EOM are normal. Right eye exhibits no discharge. Left eye exhibits no discharge. No scleral icterus.  Neck: Normal range  of motion. Neck supple.  Cardiovascular: Normal rate.   Pulmonary/Chest: Effort normal. No respiratory distress.  Abdominal: She exhibits no distension.  Musculoskeletal: Normal range of motion. She exhibits no edema.  Neurological: She is alert and oriented to person, place, and time.  Skin: Skin is warm and dry.  4cm x 2cm area of swelling and erythema noted to left axilla with packing in place, packing soaked with purulent/sanginous drainage. Mild surrounding erythema. Small amount of purulent drainage able to be expelled with palpation of the wound.  Psychiatric: She has a normal mood and affect.  Nursing note and vitals  reviewed.   ED Course  Procedures  DIAGNOSTIC STUDIES: Oxygen Saturation is 96% on room air, normal by my interpretation.    COORDINATION OF CARE: 11:02 AM Will clean wound, replace packing, and prescribe antibiotics.  Discussed treatment plan with pt at bedside and pt agreed to plan.  INCISION AND DRAINAGE PROCEDURE NOTE: Patient identification was confirmed and verbal consent was obtained. This procedure was performed by Melburn Hake, PA-C at 12:11 PM. Site: left axilla Sterile procedures observed Drainage: small amount of purulent drainage Complexity: Complex Packing used 1/4 inch iodoform  wound drained and explored loculations, rinsed with copious amounts of normal saline, wound packed with sterile gauze, covered with dry, sterile dressing.  Pt tolerated procedure well without complications.  Instructions for care discussed verbally and pt provided with additional written instructions for homecare and f/u.  Labs Review Labs Reviewed - No data to display  Imaging Review No results found.  Filed Vitals:   11/21/15 1109 11/21/15 1151  BP: 132/81 119/75  Pulse: 84 89  Temp: 98.2 F (36.8 C)   Resp: 18 18     MDM   Final diagnoses:  Abscess    Patient presents for abscess recheck. She reports being seen at an urgent care 3 days ago for an abscess, the abscess was I&D due to and patient was discharged home with wound care, denies taking antibiotics. Patient reports history of multiple abscesses to her axilla. She reports having pain and purulent drainage from the abscess over the past few days. Denies fever. VSS. Exam revealed abscess to the left axilla with sustained purulent/sanguinous drainage present. Mild surrounding erythema present small amount of purulent drainage able to be expelled from wound with palpation. Packing was removed and wound was irrigated with normal saline. The wound was repacked with quarter inch iodoform. Plan to discharge patient with  prescription for Bactrim. Discussed wound care and advised patient to follow up with her primary care provider or return to the ED in 2 days for wound recheck.  Evaluation does not show pathology requring ongoing emergent intervention or admission. Pt is hemodynamically stable and mentating appropriately. Discussed findings/results and plan with patient/guardian, who agrees with plan. All questions answered. Return precautions discussed and outpatient follow up given.    I personally performed the services described in this documentation, which was scribed in my presence. The recorded information has been reviewed and is accurate.    Satira Sark [place], [place] 11/21/15 1215  Doug Sou, MD 11/21/15 [phone]

## 2018-04-30 ENCOUNTER — Other Ambulatory Visit (HOSPITAL_BASED_OUTPATIENT_CLINIC_OR_DEPARTMENT_OTHER): Payer: Self-pay

## 2018-04-30 DIAGNOSIS — R0683 Snoring: Secondary | ICD-10-CM

## 2018-04-30 DIAGNOSIS — R5383 Other fatigue: Secondary | ICD-10-CM

## 2018-05-25 ENCOUNTER — Ambulatory Visit (HOSPITAL_BASED_OUTPATIENT_CLINIC_OR_DEPARTMENT_OTHER): Payer: Medicaid Other | Attending: Family Medicine | Admitting: Internal Medicine

## 2018-05-25 DIAGNOSIS — R0683 Snoring: Secondary | ICD-10-CM

## 2018-05-25 DIAGNOSIS — R5383 Other fatigue: Secondary | ICD-10-CM | POA: Diagnosis present

## 2018-05-25 DIAGNOSIS — G4733 Obstructive sleep apnea (adult) (pediatric): Secondary | ICD-10-CM | POA: Diagnosis not present

## 2018-06-01 NOTE — Procedures (Signed)
Patient Name: Gina Santos, [name] Study Date: 05/25/2018 Gender: Female D.O.B: 30-Jul-1981 Age (years): 37 Referring Provider: Leilani AbleBetti Reese Height (inches): 66 Interpreting Physician: Jetty Duhamellinton Young MD, ABSM Weight (lbs): 277 RPSGT: Rolene ArbourMcConnico, Yvonne BMI: 45 MRN: [id number] Neck Size: 16.50  CLINICAL INFORMATION Sleep Study Type: NPSG  Indication for sleep study: Fatigue, Snoring  Epworth Sleepiness Score: 23  SLEEP STUDY TECHNIQUE As per the AASM Manual for the Scoring of Sleep and Associated Events v2.3 (April 2016) with a hypopnea requiring 4% desaturations.  The channels recorded and monitored were frontal, central and occipital EEG, electrooculogram (EOG), submentalis EMG (chin), nasal and oral airflow, thoracic and abdominal wall motion, anterior tibialis EMG, snore microphone, electrocardiogram, and pulse oximetry.  MEDICATIONS Medications self-administered by patient taken the night of the study : none reported  SLEEP ARCHITECTURE The study was initiated at 9:58:47 PM and ended at 4:31:01 AM.  Sleep onset time was 78.4 minutes and the sleep efficiency was 62.4%%. The total sleep time was 244.8 minutes.  Stage REM latency was 79.5 minutes.  The patient spent 1.8%% of the night in stage N1 sleep, 89.0%% in stage N2 sleep, 0.0%% in stage N3 and 9.2% in REM.  Alpha intrusion was absent.  Supine sleep was 82.93%.  RESPIRATORY PARAMETERS The overall apnea/hypopnea index (AHI) was 8.3 per hour. There were 10 total apneas, including 10 obstructive, 0 central and 0 mixed apneas. There were 24 hypopneas and 4 RERAs.  The AHI during Stage REM sleep was 45.3 per hour.  AHI while supine was 7.7 per hour.  The mean oxygen saturation was 93.6%. The minimum SpO2 during sleep was 0.0%.  moderate snoring was noted during this study.  CARDIAC DATA The 2 lead EKG demonstrated sinus rhythm. The mean heart rate was 70.3 beats per minute. Other EKG findings include: None.  LEG  MOVEMENT DATA The total PLMS were 0 with a resulting PLMS index of 0.0. Associated arousal with leg movement index was 0.0 .  IMPRESSIONS - Mild obstructive sleep apnea occurred during this study (AHI = 8.3/h). - Insufficient early events and sleep to meet protocol requirements for split CPAP titration. - No significant central sleep apnea occurred during this study (CAI = 0.0/h). - Mean oxygen saturation 93.6%. - The patient snored with moderate snoring volume. - No cardiac abnormalities were noted during this study. - Clinically significant periodic limb movements did not occur during sleep. No significant associated arousals. - Difficulty initiating and maintaining sleep, attributed to back pain.  DIAGNOSIS - Obstructive Sleep Apnea (327.23 [G47.33 ICD-10])  RECOMMENDATIONS - Treatment for mild OSA is directed at symptoms. Conservative measures might include sleep position off back, weight loss, or management of nasal congestion. Other measures, including CPAP, ENT evaluation or a fitted oral appliance, would be based on clinical judgment. - Be careful with alcohol, sedatives and other CNS depressants that may worsen sleep apnea and disrupt normal sleep architecture. - Sleep hygiene should be reviewed to assess factors that may improve sleep quality. - Weight management and regular exercise should be initiated or continued if appropriate.  [Electronically signed] 06/01/2018 03:34 PM  Jetty Duhamellinton Young MD, ABSM Diplomate, American Board of Sleep Medicine   NPI: [id number]                           Jetty Duhamellinton Young Diplomate, American Board of Sleep Medicine  ELECTRONICALLY SIGNED ON:  06/01/2018, 3:29 PM [facility] PH: [phone]   FX: [phone]) [phone] ACCREDITED  BY THE AMERICAN ACADEMY OF SLEEP MEDICINE

## 2018-06-04 ENCOUNTER — Encounter: Payer: Self-pay | Admitting: Cardiovascular Disease

## 2018-06-04 ENCOUNTER — Ambulatory Visit (INDEPENDENT_AMBULATORY_CARE_PROVIDER_SITE_OTHER): Payer: Medicaid Other | Admitting: Cardiovascular Disease

## 2018-06-04 VITALS — BP 128/88 | HR 86 | Ht 66.0 in | Wt 275.0 lb

## 2018-06-04 DIAGNOSIS — R0789 Other chest pain: Secondary | ICD-10-CM | POA: Diagnosis not present

## 2018-06-04 NOTE — Patient Instructions (Addendum)
Medication Instructions:  Your physician recommends that you continue on your current medications as directed. Please refer to the Current Medication list given to you today.   Labwork: Your physician recommends that you return for lab work in: 1 week before test.   Testing/Procedures: Your physician has requested that you have cardiac CT. Cardiac computed tomography (CT) is a painless test that uses an x-ray machine to take clear, detailed pictures of your heart. For further information please visit [email or web address]. Please follow instruction sheet as given.    Follow-Up: Follow up with Dr. Allyson SabalBerry as needed.   Any Other Special Instructions Will Be Listed Below (If Applicable).  [facility] at ________________AM (30-45 minutes prior to test start time)  [facility] [address] [place], [place] [address] [phone]  Proceed to the [facility] Radiology Department (First Floor).  Please follow these instructions carefully (unless otherwise directed):  Hold all erectile dysfunction medications at least 48 hours prior to test.  On the Night Before the Test: . Drink plenty of water. . Do not consume any caffeinated/decaffeinated beverages or chocolate 12 hours prior to your test. . Do not take any antihistamines 12 hours prior to your test.  On the Day of the Test: . Drink plenty of water. Do not drink any water within one hour of the test. . Do not eat any food 4 hours prior to the test. . You may take your regular medications prior to the test. I After the Test: . Drink plenty of water. . After receiving IV contrast, you may experience a mild flushed feeling. This is normal. . On occasion, you may experience a mild rash up to 24 hours after the test. This is not dangerous. If this occurs, you can take Benadryl 25 mg and increase your fluid intake. . If you experience trouble breathing, this can  be serious. If it is severe call 911 IMMEDIATELY. If it is mild, please call our office. . If you take any of these medications: Glipizide/Metformin, Avandament, Glucavance, please do not take 48 hours after completing test.    If you need a refill on your cardiac medications before your next appointment, please call your pharmacy.

## 2018-06-04 NOTE — Assessment & Plan Note (Signed)
Ms. Gina SpanishReyeswas referred to me by Dr. Pecola Leisureeese for atypical chest pain.  Risk factors include tobacco abuse.  She is had pain for the last 6 months occurring 2 times per week lasting for minutes at a time occasional left upper extremity radiation.  Does have back issues.  I am going to get a coronary CTA to further evaluate.

## 2018-06-04 NOTE — Progress Notes (Signed)
06/04/2018 Gina Santos   07/25/1981  [id number]  Primary Physician Leilani Ableeese, Betti, MD Primary Cardiologist: Runell GessJonathan J Juquan Reznick MD Nicholes CalamityFACP, FACC, FAHA, [place]  HPI:  Gina Santos is a 37 y.o. severely overweight engaged Caucasian female mother of 4 children referred by Dr. Pecola Leisureeese for cardiovascular evaluation because of chest pain.  She is a disabled Cytogeneticistveteran.  Her risk factors include discontinue tobacco abuse with 20 pack years having smoked a pack a day and quit 10/18.  She has no other risk factors.  There is no family history.  She is never had a heart attack or stroke.  She is had new onset chest pain 6 months ago occurring several times a week lasting minutes at a time occurring randomly with some left upper extremity radiation and occasional shortness of breath.   Current Meds  Medication Sig  . methadone (DOLOPHINE) 10 MG/ML solution Take 155 mg by mouth daily.   Marland Kitchen. omeprazole (PRILOSEC) 40 MG capsule Take 40 mg by mouth daily.  . Thyroid (LEVOTHYROXINE-LIOTHYRONINE PO) Take 1 tablet by mouth every morning.     No Known Allergies  Social History   Socioeconomic History  . Marital status: Divorced    Spouse name: Not on file  . Number of children: Not on file  . Years of education: Not on file  . Highest education level: Not on file  Occupational History  . Not on file  Social Needs  . Financial resource strain: Not on file  . Food insecurity:    Worry: Not on file    Inability: Not on file  . Transportation needs:    Medical: Not on file    Non-medical: Not on file  Tobacco Use  . Smoking status: Former Smoker    Packs/day: 1.00    Types: Cigarettes  . Smokeless tobacco: Never Used  Substance and Sexual Activity  . Alcohol use: No  . Drug use: No  . Sexual activity: Not on file  Lifestyle  . Physical activity:    Days per week: Not on file    Minutes per session: Not on file  . Stress: Not on file  Relationships  . Social connections:    Talks on phone: Not on  file    Gets together: Not on file    Attends religious service: Not on file    Active member of club or organization: Not on file    Attends meetings of clubs or organizations: Not on file    Relationship status: Not on file  . Intimate partner violence:    Fear of current or ex partner: Not on file    Emotionally abused: Not on file    Physically abused: Not on file    Forced sexual activity: Not on file  Other Topics Concern  . Not on file  Social History Narrative  . Not on file     Review of Systems: General: negative for chills, fever, night sweats or weight changes.  Cardiovascular: negative for chest pain, dyspnea on exertion, edema, orthopnea, palpitations, paroxysmal nocturnal dyspnea or shortness of breath Dermatological: negative for rash Respiratory: negative for cough or wheezing Urologic: negative for hematuria Abdominal: negative for nausea, vomiting, diarrhea, bright red blood per rectum, melena, or hematemesis Neurologic: negative for visual changes, syncope, or dizziness All other systems reviewed and are otherwise negative except as noted above.    Blood pressure 128/88, pulse 86, height 5\' 6"  (1.676 m), weight 275 lb (124.7 kg).  General appearance: alert  and no distress Neck: no adenopathy, no carotid bruit, no JVD, supple, symmetrical, trachea midline and thyroid not enlarged, symmetric, no tenderness/mass/nodules Lungs: clear to auscultation bilaterally Heart: regular rate and rhythm, S1, S2 normal, no murmur, click, rub or gallop Extremities: extremities normal, atraumatic, no cyanosis or edema Pulses: 2+ and symmetric Skin: Skin color, texture, turgor normal. No rashes or lesions Neurologic: Alert and oriented X 3, normal strength and tone. Normal symmetric reflexes. Normal coordination and gait  EKG normal sinus rhythm 86 without ST or T wave changes.  I personally reviewed this EKG.  ASSESSMENT AND PLAN:   Tobacco abuse 20 pack years of tobacco  abuse having quit 10/18.  Atypical chest pain Ms. Reyeswas referred to me by Dr. Pecola Leisure for atypical chest pain.  Risk factors include tobacco abuse.  She is had pain for the last 6 months occurring 2 times per week lasting for minutes at a time occasional left upper extremity radiation.  Does have back issues.  I am going to get a coronary CTA to further evaluate.      Runell Gess MD FACP,FACC,FAHA, [facility] 06/04/2018 10:27 AM

## 2018-06-04 NOTE — Assessment & Plan Note (Signed)
20 pack years of tobacco abuse having quit 10/18.

## 2018-06-29 ENCOUNTER — Emergency Department (HOSPITAL_COMMUNITY): Payer: Medicaid Other

## 2018-06-29 ENCOUNTER — Emergency Department (HOSPITAL_COMMUNITY)
Admission: EM | Admit: 2018-06-29 | Discharge: 2018-06-30 | Disposition: A | Discharge status: Home / Self Care | Payer: Medicaid Other | Attending: Emergency Medicine | Admitting: Emergency Medicine

## 2018-06-29 ENCOUNTER — Other Ambulatory Visit: Payer: Self-pay

## 2018-06-29 ENCOUNTER — Encounter (HOSPITAL_COMMUNITY): Payer: Self-pay | Admitting: Emergency Medicine

## 2018-06-29 DIAGNOSIS — R079 Chest pain, unspecified: Secondary | ICD-10-CM | POA: Diagnosis present

## 2018-06-29 DIAGNOSIS — Z79899 Other long term (current) drug therapy: Secondary | ICD-10-CM | POA: Insufficient documentation

## 2018-06-29 DIAGNOSIS — K219 Gastro-esophageal reflux disease without esophagitis: Secondary | ICD-10-CM | POA: Diagnosis not present

## 2018-06-29 DIAGNOSIS — Z87891 Personal history of nicotine dependence: Secondary | ICD-10-CM | POA: Insufficient documentation

## 2018-06-29 LAB — I-STAT BETA HCG BLOOD, ED (MC, WL, AP ONLY)

## 2018-06-29 LAB — BASIC METABOLIC PANEL
ANION GAP: 9 (ref 5–15)
BUN: 12 mg/dL (ref 6–20)
CHLORIDE: 101 mmol/L (ref 98–111)
CO2: 26 mmol/L (ref 22–32)
Calcium: 9.1 mg/dL (ref 8.9–10.3)
Creatinine, Ser: 0.94 mg/dL (ref 0.44–1.00)
Glucose, Bld: 111 mg/dL — ABNORMAL HIGH (ref 70–99)
POTASSIUM: 4 mmol/L (ref 3.5–5.1)
SODIUM: 136 mmol/L (ref 135–145)

## 2018-06-29 LAB — I-STAT TROPONIN, ED: Troponin i, poc: 0.02 ng/mL (ref 0.00–0.08)

## 2018-06-29 LAB — CBC
HEMATOCRIT: 37.7 % (ref 36.0–46.0)
HEMOGLOBIN: 12.9 g/dL (ref 12.0–15.0)
MCH: 31.7 pg (ref 26.0–34.0)
MCHC: 34.2 g/dL (ref 30.0–36.0)
MCV: 92.6 fL (ref 78.0–100.0)
Platelets: ADEQUATE 10*3/uL (ref 150–400)
RBC: 4.07 MIL/uL (ref 3.87–5.11)
RDW: 12.8 % (ref 11.5–15.5)
WBC: 8.4 10*3/uL (ref 4.0–10.5)

## 2018-06-29 MED ORDER — GI COCKTAIL ~~LOC~~
30.0000 mL | Freq: Once | ORAL | Status: AC
  Administered 2018-06-29: 30 mL via ORAL
  Filled 2018-06-29: qty 30

## 2018-06-29 MED ORDER — PANTOPRAZOLE SODIUM 40 MG PO TBEC: 40.0000 mg | DELAYED_RELEASE_TABLET | Freq: Every day | ORAL | Status: DC

## 2018-06-29 MED ORDER — PANTOPRAZOLE SODIUM 40 MG PO TBEC
40.0000 mg | DELAYED_RELEASE_TABLET | Freq: Once | ORAL | Status: AC
  Administered 2018-06-29: 40 mg via ORAL
  Filled 2018-06-29: qty 1

## 2018-06-29 NOTE — ED Provider Notes (Signed)
[facility] EMERGENCY DEPARTMENT Provider Note   CSN: [id number] Arrival date & time: 06/29/18  1828     History   Chief Complaint Chief Complaint  Patient presents with  . Chest Pain    HPI Gina Santos is a 37 y.o. female.  The history is provided by the patient and medical records.  Chest Pain      37 y.o. F with hx of anxiety, back pain, depression, migraine headaches, GERD, presenting to the ED with chest pain.  Patient states this has been an ongoing issues for several months.  States today, she feels like her symptoms are more due to GERD.  She reports lower central chest pain, described as burning and almost like something is "stuck" at the bottom of her esophagus.  She is not having any difficulty swallowing, has been eating/drinking fine.  No vomiting or diarrhea.  States she recently ran out of her omeprazole and called her doctor, however was told that she should not be on prilosec for extended periods of time due to risk of dementia.  States she has been trying to take zantac, however no relief.  States occasionally some pain in LUQ, none currently.  She was referred to a cardiologist for chest pain as well, however states those pains she was having previously are different than what she is feeling today.  She was scheduled for a coronary CT but has never had that done.  Past Medical History:  Diagnosis Date  . Anxiety   . Back pain   . Depression   . Herniated intervertebral disc   . Migraine headache   . Thyroid disease     Patient Active Problem List   Diagnosis Date Noted  . Atypical chest pain 06/04/2018  . Cellulitis 03/21/2015  . Cellulitis of right leg 03/21/2015  . Abdominal pain, chronic, epigastric 03/21/2015  . Tobacco abuse 03/21/2015    Past Surgical History:  Procedure Laterality Date  . CHOLECYSTECTOMY    . TONSILLECTOMY       OB History   None      Home Medications    Prior to Admission medications   Medication  Sig Start Date End Date Taking? Authorizing Provider  methadone (DOLOPHINE) 10 MG/ML solution Take 155 mg by mouth daily.     [provider]  omeprazole (PRILOSEC) 40 MG capsule Take 40 mg by mouth daily.    [provider]  Thyroid (LEVOTHYROXINE-LIOTHYRONINE PO) Take 1 tablet by mouth every morning.    [provider]    Family History Family History  Problem Relation Age of Onset  . Cirrhosis Father        Died in his 67s  . Diabetes Maternal Uncle     Social History Social History   Tobacco Use  . Smoking status: Former Smoker    Packs/day: 1.00    Types: Cigarettes  . Smokeless tobacco: Never Used  Substance Use Topics  . Alcohol use: No  . Drug use: No     Allergies   Patient has no known allergies.   Review of Systems Review of Systems  Cardiovascular: Positive for chest pain.  All other systems reviewed and are negative.    Physical Exam Updated Vital Signs BP (!) 126/103 (BP Location: Right Arm)   Pulse 86   Temp 98.1 F (36.7 C) (Oral)   Resp 18   LMP 06/28/2018 Comment: pt shielded  SpO2 98%   Physical Exam  Constitutional: She is oriented to  person, place, and time. She appears well-developed and well-nourished.  Obese, NAD  HENT:  Head: Normocephalic and atraumatic.  Mouth/Throat: Oropharynx is clear and moist.  Eyes: Pupils are equal, round, and reactive to light. Conjunctivae and EOM are normal.  Neck: Normal range of motion.  Cardiovascular: Normal rate, regular rhythm and normal heart sounds.  Pulmonary/Chest: Effort normal and breath sounds normal. She has no decreased breath sounds. She has no wheezes. She has no rales.  Abdominal: Soft. Bowel sounds are normal.  Musculoskeletal: Normal range of motion.  Neurological: She is alert and oriented to person, place, and time.  Skin: Skin is warm and dry.  Psychiatric: She has a normal mood and affect.  Nursing note and vitals reviewed.    ED Treatments /  Results  Labs (all labs ordered are listed, but only abnormal results are displayed) Labs Reviewed  BASIC METABOLIC PANEL - Abnormal; Notable for the following components:      Result Value   Glucose, Bld 111 (*)    All other components within normal limits  CBC  I-STAT TROPONIN, ED  I-STAT BETA HCG BLOOD, ED ([facility], [facility], [facility] ONLY)    EKG EKG Interpretation  Date/Time:  Saturday June 29 2018 18:36:32 EDT Ventricular Rate:  107 PR Interval:  146 QRS Duration: 68 QT Interval:  316 QTC Calculation: 421 R Axis:   72 Text Interpretation:  Sinus tachycardia Nonspecific T wave abnormality Abnormal ECG Confirmed by Geoffery LyonseLo, Douglas ([id number]) on 06/29/2018 11:34:46 PM   Radiology Dg Chest 2 View  Result Date: 06/29/2018 CLINICAL DATA:  Chest pain EXAM: CHEST - 2 VIEW COMPARISON:  02/18/2015 chest radiograph. FINDINGS: Stable cardiomediastinal silhouette with normal heart size. No pneumothorax. No pleural effusion. Lungs appear clear, with no acute consolidative airspace disease and no pulmonary edema. IMPRESSION: No active cardiopulmonary disease. Electronically Signed   By: Delbert PhenixJason A Poff M.D.   On: 06/29/2018 19:46    Procedures Procedures (including critical care time)  Medications Ordered in ED Medications  gi cocktail (Maalox,Lidocaine,Donnatal) (30 mLs Oral Given 06/29/18 2333)  pantoprazole (PROTONIX) EC tablet 40 mg (40 mg Oral Given 06/29/18 2333)     Initial Impression / Assessment and Plan / ED Course  I have reviewed the triage vital signs and the nursing notes.  Pertinent labs & imaging results that were available during my care of the patient were reviewed by me and considered in my medical decision making (see chart for details).  37 year old female here with midsternal chest pain which she describes as "burning" and sensation that something is "stuck" at the distal end of the esophagus.  Ran out of omeprazole recently, no relief with Zantac at home.  Has had some intermittent  chest pains over the past few months, however reports those pains are different than this one today.  She is afebrile and nontoxic. Lungs CTAB, no distress.  Chest wall non-tender.  EKG NSR, no acute ischemia.  Labs reassuring.  CXR clear.  Suspect patients today due to uncontrolled GERD, especially since she has been off her omeprazole.  Lower suspicion for ACS, PE, dissection, acute cardiac event.  Given GI cocktail here as well as protonix.  Will reassess.  12:12 AM Patient feeling better after medications here. Tolerating PO well at this time. Feels ready to go home.  States she would like to try trial of nexium as her mother had good repsonse with this  and was actually able to come off PPI after a month or two.  Feel this  is reasonable.  Will have her follow-up closely with PCP.  She will return here for any new/acute changes.  Final Clinical Impressions(s) / ED Diagnoses   Final diagnoses:  Chest pain in adult  Gastroesophageal reflux disease without esophagitis    ED Discharge Orders         Ordered    esomeprazole (NEXIUM) 40 MG capsule  Daily     06/30/18 0014           Garlon Hatchet, PA-C 06/30/18 0413    Geoffery Lyons, MD 06/30/18 [phone]

## 2018-06-29 NOTE — ED Triage Notes (Signed)
Pt c/o intermittent chest pain with shortness of breath and occasional nausea. Has been seen by cardiology for same, states she was supposed to schedule a cardiac cath, but has been unable to.

## 2018-06-30 MED ORDER — ESOMEPRAZOLE MAGNESIUM 40 MG PO CPDR: 40.0000 mg | DELAYED_RELEASE_CAPSULE | Freq: Every day | ORAL | 0 refills | Status: DC

## 2018-06-30 NOTE — Discharge Instructions (Addendum)
Take the prescribed medication as directed.  Try to watch intake of spicy/acidic foods as this can worsen acid reflux. Follow-up with your primary care doctor. Return to the ED for new or worsening symptoms.

## 2018-06-30 NOTE — ED Notes (Signed)
Patient verbalizes understanding of discharge instructions. Opportunity for questioning and answers were provided. Armband removed by staff, pt discharged from ED, ambulatory to the lobby with family.  

## 2018-07-30 ENCOUNTER — Ambulatory Visit (HOSPITAL_COMMUNITY): Payer: Medicaid Other

## 2018-07-30 ENCOUNTER — Ambulatory Visit (HOSPITAL_COMMUNITY): Admission: RE | Admit: 2018-07-30 | Payer: Medicaid Other | Source: Ambulatory Visit

## 2019-03-19 ENCOUNTER — Telehealth: Payer: Self-pay | Admitting: Cardiovascular Disease

## 2019-03-19 NOTE — Telephone Encounter (Signed)
Spoke to patient , she not sure why  CCTA in oct 2019 was cancelled , but has developed more chest pain   patt schedule with Dr Allyson Sabal Vic Ripper  03/21/19 - pt aware it will virtual visit.

## 2019-03-19 NOTE — Telephone Encounter (Signed)
New Message   Patient is calling because she was to have a CT in 07/2018 and it was cancelled. She is calling to see if that can be rescheduled now or does she still need to have the CT. Please call.

## 2019-03-20 ENCOUNTER — Telehealth: Payer: Self-pay | Admitting: Cardiovascular Disease

## 2019-03-20 NOTE — Telephone Encounter (Signed)
smartphone/ consent/ my chart via text/ pre reg completed  °

## 2019-03-21 ENCOUNTER — Telehealth (INDEPENDENT_AMBULATORY_CARE_PROVIDER_SITE_OTHER): Payer: Medicaid Other | Admitting: Cardiovascular Disease

## 2019-03-21 ENCOUNTER — Telehealth: Payer: Self-pay

## 2019-03-21 ENCOUNTER — Encounter: Payer: Self-pay | Admitting: Cardiovascular Disease

## 2019-03-21 DIAGNOSIS — R0789 Other chest pain: Secondary | ICD-10-CM

## 2019-03-21 DIAGNOSIS — Z720 Tobacco use: Secondary | ICD-10-CM | POA: Diagnosis not present

## 2019-03-21 MED ORDER — METOPROLOL TARTRATE 50 MG PO TABS: 50.0000 mg | ORAL_TABLET | Freq: Once | ORAL | 0 refills | Status: DC

## 2019-03-21 NOTE — Progress Notes (Signed)
Virtual Visit via Video Note   This visit type was conducted due to national recommendations for restrictions regarding the COVID-19 Pandemic (e.g. social distancing) in an effort to limit this patient's exposure and mitigate transmission in our community.  Due to her co-morbid illnesses, this patient is at least at moderate risk for complications without adequate follow up.  This format is felt to be most appropriate for this patient at this time.  All issues noted in this document were discussed and addressed.  A limited physical exam was performed with this format.  Please refer to the patient's chart for her consent to telehealth for [facility].   Date:  03/21/2019   ID:  Gina Santos, DOB 20-Feb-1981, MRN [id number]  Patient Location: Home Provider Location: Home  PCP:  Leilani Able, MD  Cardiologist: Dr. Nanetta Batty Electrophysiologist:  None   Evaluation Performed:  Follow-Up Visit  Chief Complaint: Atypical chest pain  History of Present Illness:    Gina Santos is a 38 y.o. severely overweight engaged Caucasian female mother of 4 children referred by Dr. Pecola Leisure for cardiovascular evaluation because of chest pain.  She is a disabled Cytogeneticist.    Last saw her in the office 06/04/2018.  Her risk factors include discontinue tobacco abuse with 20 pack years having smoked a pack a day and quit 10/18.  She has no other risk factors.  There is no family history.  She is never had a heart attack or stroke.  She is had new onset chest pain 12 months ago occurring several times a week lasting minutes at a time occurring randomly with some left upper extremity radiation and occasional shortness of breath.  I had ordered a coronary CTA which unfortunately never got scheduled.  Since I saw her 9 months ago she continues to have daily chest pain sometimes lasting up to half an hour at a time with left upper extremity radiation.  This is worse during periods of stress.  She unfortunately has gone  back to smoking as well.  She is sheltering at home and socially distancing.  The patient does not have symptoms concerning for COVID-19 infection (fever, chills, cough, or new shortness of breath).    Past Medical History:  Diagnosis Date  . Anxiety   . Back pain   . Depression   . Herniated intervertebral disc   . Migraine headache   . Thyroid disease    Past Surgical History:  Procedure Laterality Date  . CHOLECYSTECTOMY    . TONSILLECTOMY       No outpatient medications have been marked as taking for the 03/21/19 encounter (Appointment) with Runell Gess, MD.     Allergies:   Patient has no known allergies.   Social History   Tobacco Use  . Smoking status: Former Smoker    Packs/day: 1.00    Types: Cigarettes  . Smokeless tobacco: Never Used  Substance Use Topics  . Alcohol use: No  . Drug use: No     Family Hx: The patient's family history includes Cirrhosis in her father; Diabetes in her maternal uncle.  ROS:   Please see the history of present illness.     All other systems reviewed and are negative.   Prior CV studies:   The following studies were reviewed today:  None  Labs/Other Tests and Data Reviewed:    EKG:  No ECG reviewed.  Recent Labs: 06/29/2018: BUN 12; Creatinine, Ser 0.94; Hemoglobin 12.9; Platelets PLATELET CLUMPS NOTED ON SMEAR,  COUNT APPEARS ADEQUATE; Potassium 4.0; Sodium 136   Recent Lipid Panel No results found for: CHOL, TRIG, HDL, CHOLHDL, LDLCALC, LDLDIRECT  Wt Readings from Last 3 Encounters:  06/04/18 275 lb (124.7 kg)  05/25/18 277 lb (125.6 kg)  11/21/15 264 lb (119.7 kg)     Objective:    Vital Signs:  There were no vitals taken for this visit.   VITAL SIGNS:  reviewed GEN:  no acute distress RESPIRATORY:  normal respiratory effort, symmetric expansion NEURO:  alert and oriented x 3, no obvious focal deficit PSYCH:  normal affect  ASSESSMENT & PLAN:    1. Atypical chest pain- 1 year history of  atypical chest pain occurring on a daily basis worse during periods of stress.  Her only risk factor is tobacco abuse.  I am going to reorder coronary CTA to further evaluate. 2. Tobacco abuse- she had stopped smoking 10/18 but has since restarted.  We talked about the importance of smoking cessation  COVID-19 Education: The signs and symptoms of COVID-19 were discussed with the patient and how to seek care for testing (follow up with PCP or arrange E-visit).  The importance of social distancing was discussed today.  Time:   Today, I have spent 6 minutes with the patient with telehealth technology discussing the above problems.     Medication Adjustments/Labs and Tests Ordered: Current medicines are reviewed at length with the patient today.  Concerns regarding medicines are outlined above.   Tests Ordered: No orders of the defined types were placed in this encounter.   Medication Changes: No orders of the defined types were placed in this encounter.   Disposition:  Follow up prn  Signed, Nanetta BattyJonathan Chrystel Barefield, MD  03/21/2019 7:08 AM    [facility]

## 2019-03-21 NOTE — Patient Instructions (Addendum)
Please arrive at the [facility] main entrance of [facility] at xx:xx AM (30-45 minutes prior to test start time)  [facility] [address] [place], [place] [address] [phone]  Proceed to the [facility] Radiology Department (First Floor).  Please follow these instructions carefully (unless otherwise directed):   On the Night Before the Test: . Be sure to Drink plenty of water. . Do not consume any caffeinated/decaffeinated beverages or chocolate 12 hours prior to your test. . Do not take any antihistamines 12 hours prior to your test. . If the patient has contrast allergy: ? Patient will need a prescription for Prednisone and very clear instructions (as follows): 1. Prednisone 50 mg - take 13 hours prior to test 2. Take another Prednisone 50 mg 7 hours prior to test 3. Take another Prednisone 50 mg 1 hour prior to test 4. Take Benadryl 50 mg 1 hour prior to test . Patient must complete all four doses of above prophylactic medications. . Patient will need a ride after test due to Benadryl.  On the Day of the Test: . Drink plenty of water. Do not drink any water within one hour of the test. . Do not eat any food 4 hours prior to the test. . You may take your regular medications prior to the test.  . Take metoprolol (Lopressor) 50 mg two hours prior to test.       After the Test: . Drink plenty of water. . After receiving IV contrast, you may experience a mild flushed feeling. This is normal. . On occasion, you may experience a mild rash up to 24 hours after the test. This is not dangerous. If this occurs, you can take Benadryl 25 mg and increase your fluid intake. . If you experience trouble breathing, this can be serious. If it is severe call 911 IMMEDIATELY. If it is mild, please call our office. . If you take any of these medications: Glipizide/Metformin, Avandament, Glucavance, please do not take 48 hours after completing test.  Lab work: Your  physician recommends that you return for lab work 3-7 DAYS PRIOR TO YOUR SCHEDULED CORONARY CTA LABS: -BMP -CBC If you have labs (blood work) drawn today and your tests are completely normal, you will receive your results only by: Marland Kitchen MyChart Message (if you have MyChart) OR . A paper copy in the mail If you have any lab test that is abnormal or we need to change your treatment, we will call you to review the results.  Follow-Up: At [facility], you and your health needs are our priority.  As part of our continuing mission to provide you with exceptional heart care, we have created designated Provider Care Teams.  These Care Teams include your primary Cardiologist (physician) and Advanced Practice Providers (APPs -  Physician Assistants and Nurse Practitioners) who all work together to provide you with the care you need, when you need it. You may schedule a follow up appointment AS NEEDED unless your results are abnormal. If so, you will be scheduled for a follow up appointment.  Any Other Special Instructions Will Be Listed Below (If Applicable). Please have Dr. Jocelyn Lamer Reese's office fax the results of your most recent Lipid Profile to our office. Fax #: [phone]

## 2019-03-21 NOTE — Telephone Encounter (Signed)
Patient and/or DPR-approved person aware of AVS instructions and verbalized understanding. Letter including After Visit Summary and any other necessary documents to be mailed to the patient's address on file. AVS to be sent to pt email

## 2019-03-27 NOTE — Telephone Encounter (Signed)
Pt had appt 5/22 with Dr. Allyson Sabal; coronary CTA reordered duirng this appt

## 2019-04-08 ENCOUNTER — Ambulatory Visit: Payer: Medicaid Other

## 2019-04-10 ENCOUNTER — Telehealth (HOSPITAL_COMMUNITY): Payer: Self-pay | Admitting: Emergency Medicine

## 2019-04-10 NOTE — Telephone Encounter (Signed)
Left message on voicemail with name and callback number Astin Rape RN Navigator Cardiac Imaging [facility] [phone] Office [phone] Cell  

## 2019-04-11 ENCOUNTER — Encounter: Payer: Medicaid Other | Admitting: *Deleted

## 2019-04-11 ENCOUNTER — Other Ambulatory Visit: Payer: Self-pay

## 2019-04-11 ENCOUNTER — Ambulatory Visit (HOSPITAL_COMMUNITY)
Admission: RE | Admit: 2019-04-11 | Discharge: 2019-04-11 | Disposition: A | Discharge status: Home / Self Care | Payer: Medicaid Other | Source: Ambulatory Visit | Attending: Cardiovascular Disease | Admitting: Cardiovascular Disease

## 2019-04-11 ENCOUNTER — Ambulatory Visit (HOSPITAL_COMMUNITY): Payer: Medicaid Other

## 2019-04-11 DIAGNOSIS — R0789 Other chest pain: Secondary | ICD-10-CM

## 2019-04-11 DIAGNOSIS — Z006 Encounter for examination for normal comparison and control in clinical research program: Secondary | ICD-10-CM

## 2019-04-11 MED ORDER — METOPROLOL TARTRATE 5 MG/5ML IV SOLN
5.0000 mg | INTRAVENOUS | Status: DC | PRN
  Filled 2019-04-11: qty 5

## 2019-04-11 MED ORDER — NITROGLYCERIN 0.4 MG SL SUBL
0.8000 mg | SUBLINGUAL_TABLET | Freq: Once | SUBLINGUAL | Status: AC
  Administered 2019-04-11: 0.8 mg via SUBLINGUAL
  Filled 2019-04-11: qty 25

## 2019-04-11 MED ORDER — NITROGLYCERIN 0.4 MG SL SUBL
SUBLINGUAL_TABLET | SUBLINGUAL | Status: AC
  Filled 2019-04-11: qty 2

## 2019-04-11 MED ORDER — IOHEXOL 350 MG/ML SOLN
100.0000 mL | Freq: Once | INTRAVENOUS | Status: AC | PRN
  Administered 2019-04-11: 13:00:00 100 mL via INTRAVENOUS

## 2019-04-11 NOTE — Research (Signed)
CADFEM Informed Consent                  Subject Name:   Gina Santos   Subject met inclusion and exclusion criteria.  The informed consent form, study requirements and expectations were reviewed with the subject and questions and concerns were addressed prior to the signing of the consent form.  The subject verbalized understanding of the trial requirements.  The subject agreed to participate in the CADFEM trial and signed the informed consent.  The informed consent was obtained prior to performance of any protocol-specific procedures for the subject.  A copy of the signed informed consent was given to the subject and a copy was placed in the subject's medical record.   [place] Chalmers, Research Assistant 04/11/2019   11:30 a.m.

## 2019-06-02 ENCOUNTER — Telehealth: Payer: Self-pay | Admitting: Cardiovascular Disease

## 2019-06-02 NOTE — Telephone Encounter (Signed)
Returned call to patient of Dr. Gwenlyn Found who reports chest pain for about 1 month, occurs daily . She reports left sided chest pain, under breast. She describes this as a tightening, uncomfortable feeling. The pain "may" radiate to shoulder. Denies SOB, N/V. Explained that her CT results were normal.  She had a normal coronary CT in June 2020 IMPRESSION: 1.  Calcium score 0  2.  Normal right dominant coronary arteries  She would like MD to review her concerns and advise. Routed to Dr. Gwenlyn Found & Chima RN

## 2019-06-02 NOTE — Telephone Encounter (Signed)
New Message     Pt c/o of Chest Pain: STAT if CP now or developed within 24 hours  1. Are you having CP right now? A little bit right now   2. Are you experiencing any other symptoms (ex. SOB, nausea, vomiting, sweating)? No   3. How long have you been experiencing CP? At least a month now   4. Is your CP continuous or coming and going? Coming and going   5. Have you taken Nitroglycerin? Yes, 1 day and she said it did not relieve the pain  ?

## 2019-06-03 NOTE — Telephone Encounter (Signed)
Happy to see her back anytime although her coronary calcium score was 0 she had normal coronary arteries suggesting her pain is noncardiac.

## 2019-06-12 NOTE — Telephone Encounter (Signed)
Pt aware of the following: "Happy to see her back anytime although her coronary calcium score was 0 she had normal coronary arteries suggesting her pain is noncardiac."  She would like an OV to discuss

## 2020-08-18 IMAGING — CT CT HEAR MORPH WITH CTA COR WITH SCORE WITH CA WITH CONTRAST AND
4 of 7 series · 8 of 20 positions shown, 9 images · IV contrast (APPLIED)
Comparison: None.
COMPARISON: None.

Addendum:
EXAM:
OVER-READ INTERPRETATION  CT CHEST

The following report is an over-read performed by radiologist Dr.
Ferienhaus Erxleben [REDACTED] on 04/11/2019. This
over-read does not include interpretation of cardiac or coronary
anatomy or pathology. The coronary calcium score/coronary CTA
interpretation by the cardiologist is attached.
CLINICAL DATA: Chest pain
Cardiac CTA
MEDICATIONS:
Sub lingual nitro. 4 mg and lopressor 10mg
TECHNIQUE: The patient was scanned on a Siemens Force 192 scanner. Gantry
rotation speed was 250 msecs. Collimation was. 6 mm . A 120 kV
prospective scan was triggered in the ascending thoracic aorta at
140 HU's with full mA between 30-70% of the R-R interval . Average
HR during the scan was 69 bpm. The 3D data set was interpreted on a
dedicated work station using MPR, MIP and VRT modes. A total of 80
cc of contrast was used.

[Series 8: best diast 74 % · axial · 0.39mm/px · z∈[+982,+1027]mm · 2 of 336 slices shown, 3 images]
[im 112/336  vessel]
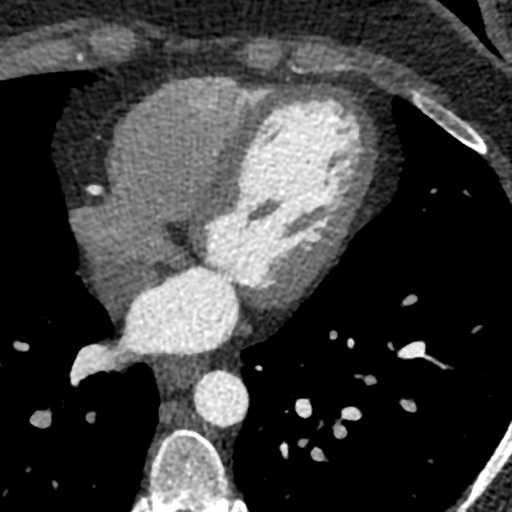
[im 112/336  lung]
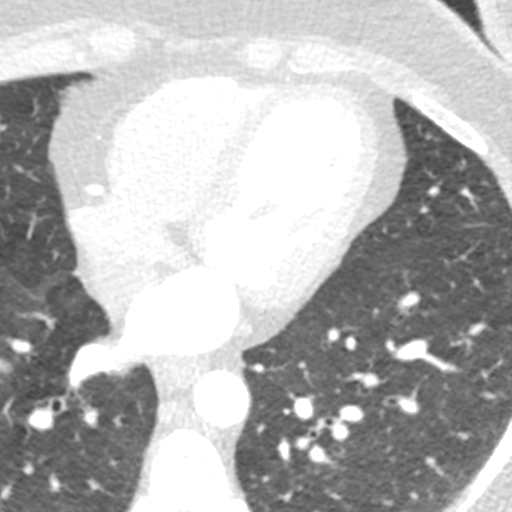
[im 224/336  vessel]
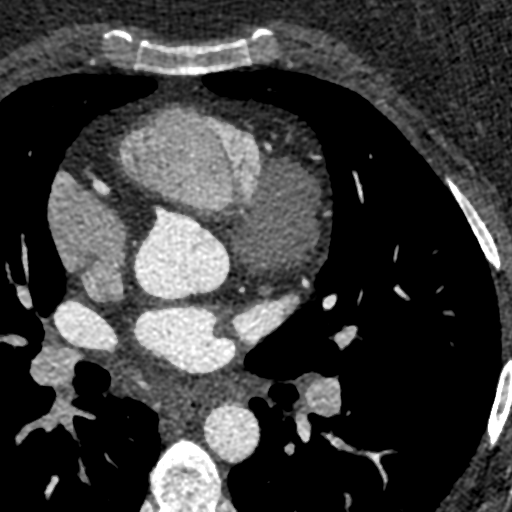

[Series 9: best syst 39 % · axial · 0.39mm/px · z∈[+982,+1027]mm · 2 of 336 slices shown]
[im 112/336  vessel]
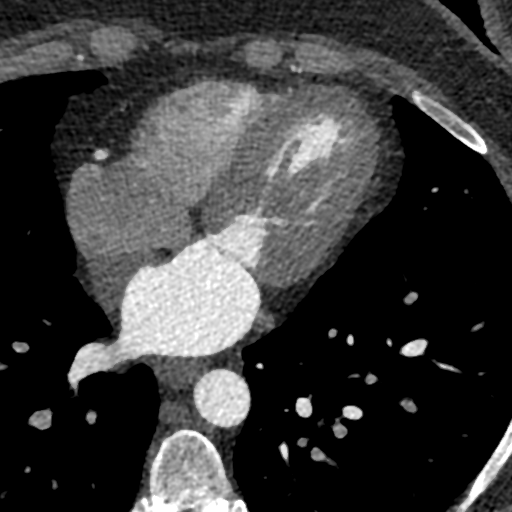
[im 224/336  vessel]
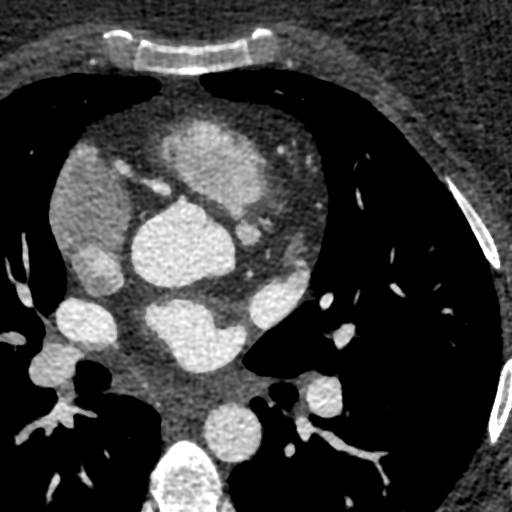

[Series 10: ts diast sharp 74 % · axial · 0.39mm/px · z∈[+982,+1027]mm · 2 of 336 slices shown]
[im 112/336  lung]
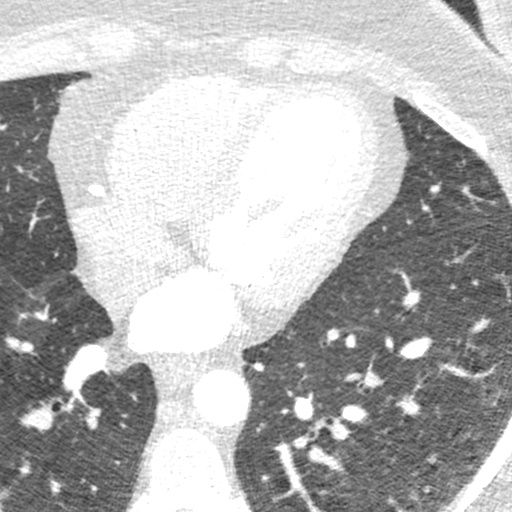
[im 224/336  lung]
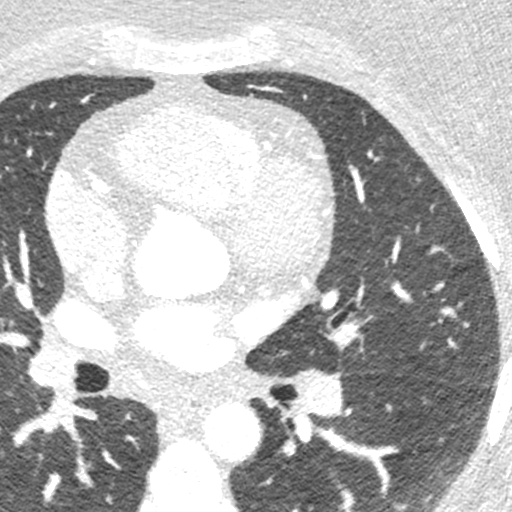

[Series 11: ts syst sharp 39 % · axial · 0.39mm/px · z∈[+982,+1027]mm · 2 of 336 slices shown]
[im 112/336  lung]
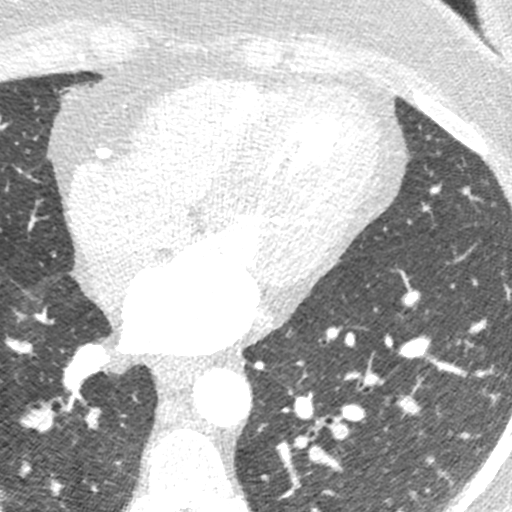
[im 224/336  lung]
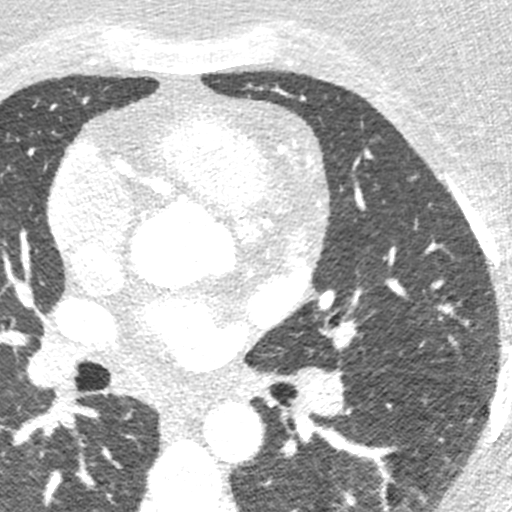

[8 of 20 positions shown; findings below may reference images not displayed]

FINDINGS: 4 mm subpleural nodule in the anterior aspect of the left upper lobe
(axial image 20 of series 14). Within the visualized portions of the
thorax there are no other larger more suspicious appearing pulmonary
nodules or masses, there is no acute consolidative airspace disease,
no pleural effusions, no pneumothorax and no lymphadenopathy.
Visualized portions of the upper abdomen demonstrates diffuse low
attenuation throughout the visualized hepatic parenchyma, indicative
of hepatic steatosis. There are no aggressive appearing lytic or
blastic lesions noted in the visualized portions of the skeleton.
IMPRESSION: 1. Hepatic steatosis.
2. 4 mm subpleural nodule in the anterior aspect of the left upper
lobe. This is nonspecific, but statistically likely benign. No
follow-up needed if patient is low-risk. Non-contrast chest CT can
be considered in 12 months if patient is high-risk. This
recommendation follows the consensus statement: Guidelines for
Management of Incidental Pulmonary Nodules Detected on CT Images:
FINDINGS: Non-cardiac: See separate report from [REDACTED]. No
significant findings on limited lung and soft tissue windows.

Calcium score: No calcium noted

Coronary Arteries: Right dominant with no anomalies

LM: Normal

LAD:  Normal

IM: Normal

D1: Normal

D2: Normal

Circumflex: Small normal mostly AV groove branch

RCA: Normal

PDA: Normal

PLA: Normal
IMPRESSION: 1.  Calcium score 0

2.  Normal right dominant coronary arteries

3.  Normal aortic root 3.2 cm

Pak Dela Rosa

*** End of Addendum ***
EXAM:
OVER-READ INTERPRETATION  CT CHEST

The following report is an over-read performed by radiologist Dr.
Ferienhaus Erxleben [REDACTED] on 04/11/2019. This
over-read does not include interpretation of cardiac or coronary
anatomy or pathology. The coronary calcium score/coronary CTA
interpretation by the cardiologist is attached.
FINDINGS: 4 mm subpleural nodule in the anterior aspect of the left upper lobe
(axial image 20 of series 14). Within the visualized portions of the
thorax there are no other larger more suspicious appearing pulmonary
nodules or masses, there is no acute consolidative airspace disease,
no pleural effusions, no pneumothorax and no lymphadenopathy.
Visualized portions of the upper abdomen demonstrates diffuse low
attenuation throughout the visualized hepatic parenchyma, indicative
of hepatic steatosis. There are no aggressive appearing lytic or
blastic lesions noted in the visualized portions of the skeleton.
IMPRESSION: 1. Hepatic steatosis.
2. 4 mm subpleural nodule in the anterior aspect of the left upper
lobe. This is nonspecific, but statistically likely benign. No
follow-up needed if patient is low-risk. Non-contrast chest CT can
be considered in 12 months if patient is high-risk. This
recommendation follows the consensus statement: Guidelines for
Management of Incidental Pulmonary Nodules Detected on CT Images:

## 2022-08-10 ENCOUNTER — Ambulatory Visit: Payer: Medicaid Other

## 2022-08-10 ENCOUNTER — Ambulatory Visit (INDEPENDENT_AMBULATORY_CARE_PROVIDER_SITE_OTHER): Payer: Medicaid Other | Admitting: Podiatry

## 2022-08-10 ENCOUNTER — Encounter: Payer: Self-pay | Admitting: Podiatry

## 2022-08-10 DIAGNOSIS — K829 Disease of gallbladder, unspecified: Secondary | ICD-10-CM | POA: Insufficient documentation

## 2022-08-10 DIAGNOSIS — B351 Tinea unguium: Secondary | ICD-10-CM | POA: Diagnosis not present

## 2022-08-10 DIAGNOSIS — K219 Gastro-esophageal reflux disease without esophagitis: Secondary | ICD-10-CM | POA: Insufficient documentation

## 2022-08-10 DIAGNOSIS — IMO0001 Reserved for inherently not codable concepts without codable children: Secondary | ICD-10-CM | POA: Insufficient documentation

## 2022-08-10 DIAGNOSIS — Z332 Encounter for elective termination of pregnancy: Secondary | ICD-10-CM | POA: Insufficient documentation

## 2022-08-10 DIAGNOSIS — Z331 Pregnant state, incidental: Secondary | ICD-10-CM | POA: Insufficient documentation

## 2022-08-10 DIAGNOSIS — F341 Dysthymic disorder: Secondary | ICD-10-CM | POA: Insufficient documentation

## 2022-08-10 DIAGNOSIS — E669 Obesity, unspecified: Secondary | ICD-10-CM | POA: Insufficient documentation

## 2022-08-10 DIAGNOSIS — F411 Generalized anxiety disorder: Secondary | ICD-10-CM | POA: Insufficient documentation

## 2022-08-10 DIAGNOSIS — G43909 Migraine, unspecified, not intractable, without status migrainosus: Secondary | ICD-10-CM | POA: Insufficient documentation

## 2022-08-10 DIAGNOSIS — F4320 Adjustment disorder, unspecified: Secondary | ICD-10-CM | POA: Insufficient documentation

## 2022-08-10 DIAGNOSIS — M2010 Hallux valgus (acquired), unspecified foot: Secondary | ICD-10-CM

## 2022-08-10 DIAGNOSIS — M545 Low back pain, unspecified: Secondary | ICD-10-CM | POA: Insufficient documentation

## 2022-08-10 DIAGNOSIS — M79676 Pain in unspecified toe(s): Secondary | ICD-10-CM | POA: Diagnosis not present

## 2022-08-10 DIAGNOSIS — R609 Edema, unspecified: Secondary | ICD-10-CM | POA: Insufficient documentation

## 2022-08-10 DIAGNOSIS — F1910 Other psychoactive substance abuse, uncomplicated: Secondary | ICD-10-CM | POA: Insufficient documentation

## 2022-08-10 NOTE — Progress Notes (Signed)
  Subjective:  Patient ID: See Beharry, female    DOB: Jan 24, 1981,  MRN: [id number] HPI Chief Complaint  Patient presents with   Foot Pain    Foot Exam - concerned about thickened, dark nails x years, varicose veins, swelling feet and legs   New Patient (Initial Visit)    41 y.o. female presents with the above complaint.   ROS: Denies fever chills nausea vomiting muscle aches pains calf pain back pain chest pain shortness of breath.  Past Medical History:  Diagnosis Date   Anxiety    Back pain    Depression    Herniated intervertebral disc    Migraine headache    Thyroid disease    Past Surgical History:  Procedure Laterality Date   CHOLECYSTECTOMY     TONSILLECTOMY      Current Outpatient Medications:    esomeprazole (NEXIUM) 40 MG capsule, Take 1 capsule (40 mg total) by mouth daily. (Patient not taking: Reported on 04/11/2019), Disp: 30 capsule, Rfl: 0   methadone (DOLOPHINE) 10 MG/ML solution, Take 155 mg by mouth daily. , Disp: , Rfl:    metoprolol tartrate (LOPRESSOR) 50 MG tablet, Take 1 tablet (50 mg total) by mouth once for 1 dose. TAKE ONE TABLET 2 HOURS PRIOR TO YOUR CORONARY CTA, Disp: 1 tablet, Rfl: 0   omeprazole (PRILOSEC) 40 MG capsule, Take 40 mg by mouth daily., Disp: , Rfl:    Thyroid (LEVOTHYROXINE-LIOTHYRONINE PO), Take 1 tablet by mouth every morning., Disp: , Rfl:   Allergies  Allergen Reactions   Gabapentin     Other reaction(s): Edema   Penicillins    Review of Systems Objective:  There were no vitals filed for this visit.  General: Well developed, nourished, in no acute distress, alert and oriented x3   Dermatological: Skin is warm, dry and supple bilateral. Nails x 10 are well maintained; remaining integument appears unremarkable at this time. There are no open sores, no preulcerative lesions, no rash or signs of infection present.  Sharp incurvated nail margins with thick yellow dystrophic nails.  Vascular: Dorsalis Pedis artery and  Posterior Tibial artery pedal pulses are 2/4 bilateral with immedate capillary fill time. Pedal hair growth present. No varicosities and no lower extremity edema present bilateral.   Neruologic: Grossly intact via light touch bilateral. Vibratory intact via tuning fork bilateral. Protective threshold with Semmes Wienstein monofilament intact to all pedal sites bilateral. Patellar and Achilles deep tendon reflexes 2+ bilateral. No Babinski or clonus noted bilateral.   Musculoskeletal: No gross boney pedal deformities bilateral. No pain, crepitus, or limitation noted with foot and ankle range of motion bilateral. Muscular strength 5/5 in all groups tested bilateral.  Gait: Unassisted, Nonantalgic.    Radiographs:  None taken  Assessment & Plan:   Assessment: Painful nails 1 through 5 bilateral nail dystrophy.  Plan: Debridement of toenails 1 through 5 bilateral.       Isaah Furry T. [place], [place]

## 2022-11-27 NOTE — H&P (Signed)
  Patient: Gina Santos  PID: [id number]  DOB: 11-09-1980  SEX: Female   Patient referred by DDS for extraction teeth  CC: On and off pain  Past Medical History:  Chest Pain or Angina, Snoring, Smoker, Morbid Obesity, gallbladder removed, Swollen ankles, Thyroid Disease    Medications: Methadone, Omeprazole    Allergies:     NKDA    Surgeries:   Gallbladder removal, Oral Surgery     Social History       Smoking:   1 ppd         Alcohol: Drug use:                             Exam: BMI 45. Caries/bone loss all remaining teeth.  No purulence, edema, fluctuance, trismus. Oral cancer screening negative. Pharynx clear. No lymphadenopathy.  Panorex:Caries/bone loss teeth # 3, 4, 5, 6, 7, 8, 9, 10, 11, 12, 13, 18, 19, 20, 21, 22, 23, 24, 25, 26, 27, 28, 29.  Assessment: ASA 3. Non-restorable  teeth # 3, 4, 5, 6, 7, 8, 9, 10, 11, 12, 13, 18, 19, 20, 21, 22, 23, 24, 25, 26, 27, 28, 29.              Plan: 1. MD Clearance obtained  2. Extraction Teeth 3, 4, 5, 6, 7, 8, 9, 10, 11, 12, 13, 18, 19, 20, 21, 22, 23, 24, 25, 26, 27, 28, 29. Alveoloplasty. Hospital Day surgery.                 Rx: n               Risks and complications explained. Questions answered.   Gae Bon, DMD

## 2022-11-30 ENCOUNTER — Other Ambulatory Visit: Payer: Self-pay

## 2022-11-30 ENCOUNTER — Encounter (HOSPITAL_COMMUNITY): Payer: Self-pay | Admitting: Oral Surgery

## 2022-11-30 MED ORDER — VANCOMYCIN HCL 1500 MG/300ML IV SOLN
1500.0000 mg | INTRAVENOUS | Status: AC
  Administered 2022-12-01 (×2): 1500 mg via INTRAVENOUS
  Filled 2022-11-30: qty 300

## 2022-11-30 NOTE — Progress Notes (Addendum)
Jeanann Balinski denies chest pain or shortness of breath.  Patient denies having any s/s of Covid in her household, also denies any known exposure to Covid.  Arelie was seen by a cardiologist in 2020 for chest pain, CT Coronary/Calcium was done- score was 0.  Dr. Lin Landsman is Maddie's PCP.  Patient reported that she saw PCP for surgical clearance, she had a repeat platelet count drawn because 1st number was low.  Lacresha said that Dr. Zenia Resides office information to Dr. Hoyt Koch.  Dr. Lupita Leash office sent it to Pre- surgery. Myra Gianotti, PA-C looked at the records from PCP , it has clearance, but labs were not included. ALlison will call Dr. Zenia Resides office.

## 2022-11-30 NOTE — Progress Notes (Signed)
Anesthesia Chart Review: Gina Santos  Case: [id number] Date/Time: 12/01/22 0830   Procedure: DENTAL RESTORATION/EXTRACTIONS   Anesthesia type: General   Pre-op diagnosis: DENTAL CARIES   Location: [facility] ROOM 04 / [facility]   Surgeons: Diona Browner, DMD       DISCUSSION: Patient is a 42 year female scheduled for the above procedure.  History includes smoking, GERD, back pain, migraines, cholecystectomy, obesity.  Normal coronary CT 04/11/19. Mild OSA 05/25/18 sleep study.   PCP Dr. Ayesha Rumpf signed medical clearance form. She last saw patient on 11/14/22 for follow-up labs after platelet count in October 2023 was 118K. Repeat labs on 11/14/22 showed WBC 8.5, H/H 12.4/36.5, PLT 143K.  Anesthesia team to evaluate on the day of surgery. She is on methadone.   VS: Ht 5\' 6"  (1.676 m)   Wt 126.1 kg   LMP 11/26/2022   BMI 44.87 kg/m   PROVIDERS: Lin Landsman, MD is PCP  - She is not followed routinely cardiology, but had evaluation for atypical chest pain with Quay Burow, MD in ~2019-2020. He had a normal coronaries with Coronary Calcium score of 0 04/11/19.    LABS: For day of surgery as indicated.    Sleep Study 05/25/18: IMPRESSIONS - Mild obstructive sleep apnea occurred during this study (AHI = 8.3/h). - Insufficient early events and sleep to meet protocol requirements for split CPAP titration. - No significant central sleep apnea occurred during this study (CAI = 0.0/h). - Mean oxygen saturation 93.6%. - The patient snored with moderate snoring volume. - No cardiac abnormalities were noted during this study. - Clinically significant periodic limb movements did not occur during sleep. No significant associated arousals. - Difficulty initiating and maintaining sleep, attributed to back pain. RECOMMENDATIONS - Treatment for mild OSA is directed at symptoms. Conservative measures might include sleep position off back, weight loss, or management of nasal congestion. Other measures,  including CPAP, ENT evaluation or a fitted oral appliance, would be based on clinical judgment...   EKG: Last EKG 06/29/18: Sinus tachycardia Nonspecific T wave abnormality Abnormal ECG Confirmed by Veryl Speak [phone]) on 06/29/2018 11:34:46 PM  CV: CT Coronary 04/11/19: IMPRESSION: 1.  Calcium score 0 2.  Normal right dominant coronary arteries 3.  Normal aortic root 3.2 cm   Past Medical History:  Diagnosis Date   Anxiety    Arthritis    Back pain    Complication of anesthesia    Depression    GERD (gastroesophageal reflux disease)    Herniated intervertebral disc    Migraine headache    Thyroid disease     Past Surgical History:  Procedure Laterality Date   CHOLECYSTECTOMY     TONSILLECTOMY      MEDICATIONS:  [START ON 12/01/2022] vancomycin (VANCOREADY) IVPB 1500 mg/300 mL    methadone (DOLOPHINE) 10 MG/ML solution   omeprazole (PRILOSEC) 20 MG capsule    Myra Gianotti, PA-C Surgical Short Stay/Anesthesiology [facility] Phone [phone] [facility] Phone [phone] 11/30/2022 6:16 PM

## 2022-11-30 NOTE — Anesthesia Preprocedure Evaluation (Addendum)
Anesthesia Evaluation  Patient identified by MRN, date of birth, ID band Patient awake    Reviewed: Allergy & Precautions, NPO status , Patient's Chart, lab work & pertinent test results  Airway Mallampati: II  TM Distance: >3 FB Neck ROM: Full    Dental no notable dental hx. (+) Poor Dentition, Dental Advisory Given   Pulmonary Current Smoker and Patient abstained from smoking.   Pulmonary exam normal breath sounds clear to auscultation       Cardiovascular Normal cardiovascular exam Rhythm:Regular Rate:Normal     Neuro/Psych  Headaches    GI/Hepatic Neg liver ROS,GERD  ,,  Endo/Other    Morbid obesity (BMI 44.87)  Renal/GU      Musculoskeletal Back pain on Methadone   Abdominal  (+) + obese  Peds  Hematology   Anesthesia Other Findings All: PCN, gabapentin  Reproductive/Obstetrics                             Anesthesia Physical Anesthesia Plan  ASA: 3  Anesthesia Plan: General   Post-op Pain Management: Ketamine IV* and Ofirmev IV (intra-op)*   Induction: Intravenous  PONV Risk Score and Plan: 3 and Treatment may vary due to age or medical condition and Ondansetron  Airway Management Planned: Oral ETT  Additional Equipment: None  Intra-op Plan:   Post-operative Plan: Extubation in OR  Informed Consent: I have reviewed the patients History and Physical, chart, labs and discussed the procedure including the risks, benefits and alternatives for the proposed anesthesia with the patient or authorized representative who has indicated his/her understanding and acceptance.     Dental advisory given  Plan Discussed with: CRNA and Surgeon  Anesthesia Plan Comments: (PAT note written 11/30/2022 by Myra Gianotti, PA-C.  )       Anesthesia Quick Evaluation

## 2022-12-01 ENCOUNTER — Other Ambulatory Visit: Payer: Self-pay

## 2022-12-01 ENCOUNTER — Ambulatory Visit (HOSPITAL_COMMUNITY)
Admission: RE | Admit: 2022-12-01 | Discharge: 2022-12-01 | Disposition: A | Discharge status: Home / Self Care | Payer: Medicaid Other | Attending: Oral Surgery | Admitting: Oral Surgery

## 2022-12-01 ENCOUNTER — Ambulatory Visit (HOSPITAL_COMMUNITY): Payer: Medicaid Other | Admitting: Vascular Surgery

## 2022-12-01 ENCOUNTER — Encounter (HOSPITAL_COMMUNITY)
Admission: RE | Disposition: A | Discharge status: Home / Self Care | Payer: Self-pay | Source: Home / Self Care | Attending: Oral Surgery

## 2022-12-01 ENCOUNTER — Ambulatory Visit (HOSPITAL_BASED_OUTPATIENT_CLINIC_OR_DEPARTMENT_OTHER): Payer: Medicaid Other | Admitting: Vascular Surgery

## 2022-12-01 DIAGNOSIS — G4733 Obstructive sleep apnea (adult) (pediatric): Secondary | ICD-10-CM | POA: Insufficient documentation

## 2022-12-01 DIAGNOSIS — Z6841 Body Mass Index (BMI) 40.0 and over, adult: Secondary | ICD-10-CM | POA: Insufficient documentation

## 2022-12-01 DIAGNOSIS — K056 Periodontal disease, unspecified: Secondary | ICD-10-CM

## 2022-12-01 DIAGNOSIS — K0889 Other specified disorders of teeth and supporting structures: Secondary | ICD-10-CM

## 2022-12-01 DIAGNOSIS — K029 Dental caries, unspecified: Secondary | ICD-10-CM | POA: Insufficient documentation

## 2022-12-01 DIAGNOSIS — F17210 Nicotine dependence, cigarettes, uncomplicated: Secondary | ICD-10-CM | POA: Diagnosis not present

## 2022-12-01 DIAGNOSIS — K219 Gastro-esophageal reflux disease without esophagitis: Secondary | ICD-10-CM | POA: Insufficient documentation

## 2022-12-01 DIAGNOSIS — F17200 Nicotine dependence, unspecified, uncomplicated: Secondary | ICD-10-CM | POA: Diagnosis not present

## 2022-12-01 HISTORY — DX: Gastro-esophageal reflux disease without esophagitis: K21.9

## 2022-12-01 HISTORY — PX: TOOTH EXTRACTION: SHX859

## 2022-12-01 HISTORY — DX: Unspecified osteoarthritis, unspecified site: M19.90

## 2022-12-01 HISTORY — DX: Other complications of anesthesia, initial encounter: T88.59XA

## 2022-12-01 LAB — CBC
HCT: 32.9 % — ABNORMAL LOW (ref 36.0–46.0)
Hemoglobin: 11.7 g/dL — ABNORMAL LOW (ref 12.0–15.0)
MCH: 32.9 pg (ref 26.0–34.0)
MCHC: 35.6 g/dL (ref 30.0–36.0)
MCV: 92.4 fL (ref 80.0–100.0)
Platelets: 255 10*3/uL (ref 150–400)
RBC: 3.56 MIL/uL — ABNORMAL LOW (ref 3.87–5.11)
RDW: 15.5 % (ref 11.5–15.5)
WBC: 8.8 10*3/uL (ref 4.0–10.5)
nRBC: 0 % (ref 0.0–0.2)

## 2022-12-01 LAB — POCT PREGNANCY, URINE: Preg Test, Ur: NEGATIVE

## 2022-12-01 SURGERY — DENTAL RESTORATION/EXTRACTIONS
Anesthesia: General

## 2022-12-01 MED ORDER — AMISULPRIDE (ANTIEMETIC) 5 MG/2ML IV SOLN: 10.0000 mg | Freq: Once | INTRAVENOUS | Status: DC | PRN

## 2022-12-01 MED ORDER — OXYCODONE HCL 5 MG/5ML PO SOLN: 5.0000 mg | Freq: Once | ORAL | Status: AC | PRN

## 2022-12-01 MED ORDER — HYDROMORPHONE HCL 1 MG/ML IJ SOLN
0.2500 mg | INTRAMUSCULAR | Status: DC | PRN
  Administered 2022-12-01: 0.25 mg via INTRAVENOUS
  Administered 2022-12-01: 0.5 mg via INTRAVENOUS

## 2022-12-01 MED ORDER — FENTANYL CITRATE (PF) 250 MCG/5ML IJ SOLN
INTRAMUSCULAR | Status: DC | PRN
  Administered 2022-12-01 (×2): 100 ug via INTRAVENOUS

## 2022-12-01 MED ORDER — CLINDAMYCIN HCL 300 MG PO CAPS: 300.0000 mg | ORAL_CAPSULE | Freq: Three times a day (TID) | ORAL | 0 refills | Status: AC

## 2022-12-01 MED ORDER — LIDOCAINE 2% (20 MG/ML) 5 ML SYRINGE
INTRAMUSCULAR | Status: AC
  Filled 2022-12-01: qty 5

## 2022-12-01 MED ORDER — OXYCODONE HCL 5 MG PO TABS: 5.0000 mg | ORAL_TABLET | Freq: Once | ORAL | Status: AC | PRN

## 2022-12-01 MED ORDER — ACETAMINOPHEN 10 MG/ML IV SOLN: 1000.0000 mg | Freq: Once | INTRAVENOUS | Status: DC | PRN

## 2022-12-01 MED ORDER — DEXMEDETOMIDINE HCL IN NACL 80 MCG/20ML IV SOLN
INTRAVENOUS | Status: DC | PRN
  Administered 2022-12-01: 4 ug via INTRAVENOUS
  Administered 2022-12-01: 16 ug via INTRAVENOUS

## 2022-12-01 MED ORDER — HYDROMORPHONE HCL 1 MG/ML IJ SOLN
INTRAMUSCULAR | Status: AC
  Administered 2022-12-01: 0.25 mg via INTRAVENOUS
  Filled 2022-12-01: qty 1

## 2022-12-01 MED ORDER — LIDOCAINE-EPINEPHRINE 2 %-1:100000 IJ SOLN
INTRAMUSCULAR | Status: AC
  Filled 2022-12-01: qty 1

## 2022-12-01 MED ORDER — OXYCODONE-ACETAMINOPHEN 5-325 MG PO TABS: 1.0000 | ORAL_TABLET | ORAL | 0 refills | Status: AC | PRN

## 2022-12-01 MED ORDER — PHENYLEPHRINE 80 MCG/ML (10ML) SYRINGE FOR IV PUSH (FOR BLOOD PRESSURE SUPPORT)
PREFILLED_SYRINGE | INTRAVENOUS | Status: DC | PRN
  Administered 2022-12-01 (×2): 160 ug via INTRAVENOUS

## 2022-12-01 MED ORDER — DEXAMETHASONE SODIUM PHOSPHATE 10 MG/ML IJ SOLN
INTRAMUSCULAR | Status: DC | PRN
  Administered 2022-12-01: 10 mg via INTRAVENOUS

## 2022-12-01 MED ORDER — ORAL CARE MOUTH RINSE: 15.0000 mL | Freq: Once | OROMUCOSAL | Status: AC

## 2022-12-01 MED ORDER — ROCURONIUM BROMIDE 10 MG/ML (PF) SYRINGE
PREFILLED_SYRINGE | INTRAVENOUS | Status: DC | PRN
  Administered 2022-12-01: 60 mg via INTRAVENOUS

## 2022-12-01 MED ORDER — ONDANSETRON HCL 4 MG/2ML IJ SOLN
INTRAMUSCULAR | Status: DC | PRN
  Administered 2022-12-01: 4 mg via INTRAVENOUS

## 2022-12-01 MED ORDER — LACTATED RINGERS IV SOLN: INTRAVENOUS | Status: DC

## 2022-12-01 MED ORDER — 0.9 % SODIUM CHLORIDE (POUR BTL) OPTIME
TOPICAL | Status: DC | PRN
  Administered 2022-12-01: 1000 mL

## 2022-12-01 MED ORDER — ONDANSETRON HCL 4 MG/2ML IJ SOLN: 4.0000 mg | Freq: Once | INTRAMUSCULAR | Status: DC | PRN

## 2022-12-01 MED ORDER — LIDOCAINE-EPINEPHRINE 2 %-1:100000 IJ SOLN
INTRAMUSCULAR | Status: DC | PRN
  Administered 2022-12-01: 17 mL

## 2022-12-01 MED ORDER — MIDAZOLAM HCL 2 MG/2ML IJ SOLN
INTRAMUSCULAR | Status: DC | PRN
  Administered 2022-12-01: 2 mg via INTRAVENOUS

## 2022-12-01 MED ORDER — OXYMETAZOLINE HCL 0.05 % NA SOLN
NASAL | Status: AC
  Filled 2022-12-01: qty 30

## 2022-12-01 MED ORDER — LIDOCAINE 2% (20 MG/ML) 5 ML SYRINGE
INTRAMUSCULAR | Status: DC | PRN
  Administered 2022-12-01: 100 mg via INTRAVENOUS

## 2022-12-01 MED ORDER — OXYCODONE HCL 5 MG PO TABS
ORAL_TABLET | ORAL | Status: AC
  Administered 2022-12-01: 5 mg via ORAL
  Filled 2022-12-01: qty 1

## 2022-12-01 MED ORDER — DEXAMETHASONE SODIUM PHOSPHATE 10 MG/ML IJ SOLN
INTRAMUSCULAR | Status: AC
  Filled 2022-12-01: qty 1

## 2022-12-01 MED ORDER — PROPOFOL 10 MG/ML IV BOLUS
INTRAVENOUS | Status: AC
  Filled 2022-12-01: qty 20

## 2022-12-01 MED ORDER — FENTANYL CITRATE (PF) 250 MCG/5ML IJ SOLN
INTRAMUSCULAR | Status: AC
  Filled 2022-12-01: qty 5

## 2022-12-01 MED ORDER — SUGAMMADEX SODIUM 200 MG/2ML IV SOLN
INTRAVENOUS | Status: DC | PRN
  Administered 2022-12-01: 400 mg via INTRAVENOUS

## 2022-12-01 MED ORDER — OXYMETAZOLINE HCL 0.05 % NA SOLN
NASAL | Status: DC | PRN
  Administered 2022-12-01 (×2): 2 via NASAL

## 2022-12-01 MED ORDER — ONDANSETRON HCL 4 MG/2ML IJ SOLN
INTRAMUSCULAR | Status: AC
  Filled 2022-12-01: qty 2

## 2022-12-01 MED ORDER — PROPOFOL 10 MG/ML IV BOLUS
INTRAVENOUS | Status: DC | PRN
  Administered 2022-12-01: 200 mg via INTRAVENOUS

## 2022-12-01 MED ORDER — SUCCINYLCHOLINE CHLORIDE 200 MG/10ML IV SOSY
PREFILLED_SYRINGE | INTRAVENOUS | Status: AC
  Filled 2022-12-01: qty 10

## 2022-12-01 MED ORDER — CHLORHEXIDINE GLUCONATE 0.12 % MT SOLN
15.0000 mL | Freq: Once | OROMUCOSAL | Status: AC
  Administered 2022-12-01: 15 mL via OROMUCOSAL

## 2022-12-01 MED ORDER — CHLORHEXIDINE GLUCONATE 0.12 % MT SOLN
OROMUCOSAL | Status: AC
  Filled 2022-12-01: qty 15

## 2022-12-01 MED ORDER — SODIUM CHLORIDE 0.9 % IR SOLN
Status: DC | PRN
  Administered 2022-12-01: 250 mL

## 2022-12-01 MED ORDER — MIDAZOLAM HCL 2 MG/2ML IJ SOLN
INTRAMUSCULAR | Status: AC
  Filled 2022-12-01: qty 2

## 2022-12-01 SURGICAL SUPPLY — 38 items
BAG COUNTER SPONGE SURGICOUNT (BAG) IMPLANT
BAG SPNG CNTER NS LX DISP (BAG)
BLADE SURG 15 STRL LF DISP TIS (BLADE) ×2 IMPLANT
BLADE SURG 15 STRL SS (BLADE) ×1
BUR CROSS CUT FISSURE 1.6 (BURR) ×2 IMPLANT
BUR EGG ELITE 4.0 (BURR) ×2 IMPLANT
CANISTER SUCT 3000ML PPV (MISCELLANEOUS) ×2 IMPLANT
COVER SURGICAL LIGHT HANDLE (MISCELLANEOUS) ×2 IMPLANT
GAUZE PACKING FOLDED 2  STR (GAUZE/BANDAGES/DRESSINGS) ×1
GAUZE PACKING FOLDED 2 STR (GAUZE/BANDAGES/DRESSINGS) ×2 IMPLANT
GLOVE BIO SURGEON STRL SZ 6.5 (GLOVE) IMPLANT
GLOVE BIO SURGEON STRL SZ7 (GLOVE) IMPLANT
GLOVE BIO SURGEON STRL SZ8 (GLOVE) ×2 IMPLANT
GLOVE BIOGEL PI IND STRL 6.5 (GLOVE) IMPLANT
GLOVE BIOGEL PI IND STRL 7.0 (GLOVE) IMPLANT
GOWN STRL REUS W/ TWL LRG LVL3 (GOWN DISPOSABLE) ×2 IMPLANT
GOWN STRL REUS W/ TWL XL LVL3 (GOWN DISPOSABLE) ×2 IMPLANT
GOWN STRL REUS W/TWL LRG LVL3 (GOWN DISPOSABLE) ×1
GOWN STRL REUS W/TWL XL LVL3 (GOWN DISPOSABLE) ×1
IV NS 1000ML (IV SOLUTION)
IV NS 1000ML BAXH (IV SOLUTION) ×2 IMPLANT
IV NS 250ML (IV SOLUTION) ×1
IV NS 250ML BAXH (IV SOLUTION) IMPLANT
KIT BASIN OR (CUSTOM PROCEDURE TRAY) ×2 IMPLANT
KIT TURNOVER KIT B (KITS) ×2 IMPLANT
NDL HYPO 25GX1X1/2 BEV (NEEDLE) ×4 IMPLANT
NEEDLE HYPO 25GX1X1/2 BEV (NEEDLE) ×2 IMPLANT
NS IRRIG 1000ML POUR BTL (IV SOLUTION) ×2 IMPLANT
PAD ARMBOARD 7.5X6 YLW CONV (MISCELLANEOUS) ×2 IMPLANT
SLEEVE IRRIGATION ELITE 7 (MISCELLANEOUS) ×2 IMPLANT
SPIKE FLUID TRANSFER (MISCELLANEOUS) ×2 IMPLANT
SPONGE SURGIFOAM ABS GEL 12-7 (HEMOSTASIS) IMPLANT
SUT CHROMIC 3 0 PS 2 (SUTURE) ×2 IMPLANT
SYR BULB IRRIG 60ML STRL (SYRINGE) ×2 IMPLANT
SYR CONTROL 10ML LL (SYRINGE) ×2 IMPLANT
TRAY ENT MC OR (CUSTOM PROCEDURE TRAY) ×2 IMPLANT
TUBING IRRIGATION (MISCELLANEOUS) ×2 IMPLANT
YANKAUER SUCT BULB TIP NO VENT (SUCTIONS) ×2 IMPLANT

## 2022-12-01 NOTE — Anesthesia Procedure Notes (Signed)
Procedure Name: Intubation Date/Time: 12/01/2022 8:59 AM  Performed by: Lance Coon, CRNAPre-anesthesia Checklist: Patient identified, Emergency Drugs available, Suction available, Patient being monitored and Timeout performed Patient Re-evaluated:Patient Re-evaluated prior to induction Oxygen Delivery Method: Circle system utilized Preoxygenation: Pre-oxygenation with 100% oxygen Induction Type: IV induction Ventilation: Mask ventilation without difficulty Laryngoscope Size: Miller and 3 Grade View: Grade II Nasal Tubes: Right, Nasal prep performed and Nasal Rae Tube size: 6.5 mm Number of attempts: 1 Airway Equipment and Method: Stylet Placement Confirmation: ETT inserted through vocal cords under direct vision, positive ETCO2 and breath sounds checked- equal and bilateral Tube secured with: Tape Dental Injury: Teeth and Oropharynx as per pre-operative assessment

## 2022-12-01 NOTE — Transfer of Care (Signed)
Immediate Anesthesia Transfer of Care Note  Patient: Gina Santos  Procedure(s) Performed: DENTAL RESTORATION/EXTRACTIONS  Patient Location: PACU  Anesthesia Type:General  Level of Consciousness: drowsy and patient cooperative  Airway & Oxygen Therapy: Patient Spontanous Breathing and Patient connected to nasal cannula oxygen  Post-op Assessment: Report given to RN and Post -op Vital signs reviewed and stable  Post vital signs: Reviewed and stable  Last Vitals:  Vitals Value Taken Time  BP 139/65 12/01/22 1015  Temp    Pulse 91 12/01/22 1017  Resp 26 12/01/22 1017  SpO2 95 % 12/01/22 1017  Vitals shown include unvalidated device data.  Last Pain:  Vitals:   12/01/22 0701  TempSrc:   PainSc: 0-No pain         Complications: No notable events documented.

## 2022-12-01 NOTE — H&P (Signed)
H&P documentation  -History and Physical Reviewed  -Patient has been re-examined  -No change in the plan of care  Gina Santos  

## 2022-12-01 NOTE — Anesthesia Postprocedure Evaluation (Signed)
Anesthesia Post Note  Patient: Gina Santos  Procedure(s) Performed: DENTAL RESTORATION/EXTRACTIONS     Patient location during evaluation: PACU Anesthesia Type: General Level of consciousness: awake and alert Pain management: pain level controlled Vital Signs Assessment: post-procedure vital signs reviewed and stable Respiratory status: spontaneous breathing, nonlabored ventilation, respiratory function stable and patient connected to nasal cannula oxygen Cardiovascular status: blood pressure returned to baseline and stable Postop Assessment: no apparent nausea or vomiting Anesthetic complications: no  No notable events documented.  Last Vitals:  Vitals:   12/01/22 1100 12/01/22 1115  BP: 120/76 117/63  Pulse: 80 77  Resp: 12 15  Temp:  36.7 C  SpO2: 90% 91%    Last Pain:  Vitals:   12/01/22 1115  TempSrc:   PainSc: 5                  Barnet Glasgow

## 2022-12-01 NOTE — Op Note (Signed)
Gina Santos, HULICK MEDICAL RECORD NO: [id number] ACCOUNT NO: [id number] DATE OF BIRTH: 1981-03-23 FACILITY: [facility] LOCATION: [facility] PHYSICIAN: Gae Bon, DDS  Operative Report   DATE OF PROCEDURE: 12/01/2022  PREOPERATIVE DIAGNOSIS:  Nonrestorable teeth numbers 3, 4, 5, 6, 7, 8, 9, 10, 11, 12, 13, 18, 19, 20, 21, 22, 23, 24, 25, 26, 27, 28, 29, 31 secondary to periodontal disease and dental caries.  POSTOPERATIVE DIAGNOSIS:  Nonrestorable teeth numbers 3, 4, 5, 6, 7, 8, 9, 10, 11, 12, 13, 18, 19, 20, 21, 22, 23, 24, 25, 26, 27, 28, 29, 31 secondary to periodontal disease and dental caries.  PROCEDURE:  Extraction teeth numbers per above.  Alveoloplasty right and left maxilla and mandible.  SURGEON:  Gae Bon, DDS  ANESTHESIA:  General, nasal intubation, Dr. Valma Cava, attending.  DESCRIPTION OF PROCEDURE:  The patient was taken to the operating room and placed on the table in supine position.  General anesthesia was administered.  Nasoendotracheal tube was placed and secured.  The eyes were protected.  The patient was draped for  surgery.  Timeout was performed.  The posterior pharynx was suctioned and a throat pack was placed.  2% lidocaine 1:100,000 epinephrine was infiltrated in an inferior alveolar block on the right and left sides with buccal infiltration in the anterior  mandible and buccal and palatal infiltration in the maxilla around the teeth to be removed.  A bite block was placed on the right side of the mouth.  A sweetheart retractor was used to retract the tongue.  A #15 blade was used to make an incision in the  buccal sulcus of tooth #18 and carried forward to tooth #26.  Another incision was made in the lingual sulcus.  The tissue was then reflected with a periosteal elevator, the teeth were elevated with 301 elevator and removed from the mouth with the dental  forceps.  There was active periodontal disease with granulated tissue, which was debrided with rongeur.   The sockets were curetted.  The periosteum was reflected to expose the alveolar crest and then alveoplasty was performed using the egg bur followed  by the bone file.  Then, this area was irrigated and closed with 3-0 chromic.  The left maxilla was operated next.  The 15 blade was used to make an incision around teeth numbers 13, 12, 11, 10, 9, 8, 7 in the buccal and palatal sulcus.  The tissue was  reflected.  The teeth were elevated.  Tooth numbers 12 and 13 fractured upon attempted removal with the dental forceps requiring removal of bone around the root tips and then these root tips were removed.  Teeth numbers 10, 9, 8, 7 were removed with the  forceps in simple fashion.  Then, the sockets were curetted.  The tissue was debrided, reflected and then alveoplasty was performed using the egg bur followed by the bone file.  Then, the left maxilla was closed. Attention was turned to the right  mandible. A 15 blade was used to make an incision around teeth number 27, 28, 29, and 31 and around teeth numbers 3, 4, 5 and 6 in the maxilla.  The periosteum was reflected.  The teeth were elevated. Teeth numbers 27, 28 and 29 were removed with a  forceps.  Tooth #31 fractured necessitating sectioning the tooth with a Stryker handpiece and removal of the roots one at a time.  Then, the periosteum was reflected.  The alveoplasty was performed in the right mandible using the  egg bur and the bone  file.  Then, the area was irrigated and closed with 3-0 chromic.  In the maxilla, the teeth were elevated with 301 elevator.  Tooth #3 fractured upon removal with the forceps, but teeth numbers 4, 5 and 6 were removed in simple fashion.  The roots were  sectioned of tooth #3 and then the roots were removed with the 301 elevator and the rongeurs.  Then, the sockets were curetted.  Tissue was debrided, reflected to expose the alveolar crest and then alveoplasty was performed using the egg bur followed by  the bone file.  Then,  the oral cavity was irrigated and suctioned.  The throat pack was removed.  The patient was left under care of anesthesia for extubation and transported to recovery with plans for discharge home through day surgery.  ESTIMATED BLOOD LOSS:  Minimal.  COMPLICATIONS:  None.  SPECIMENS:  There were no specimens.  COUNTS:  Correct.   PAA D: 12/01/2022 10:08:05 am T: 12/01/2022 10:31:00 am  JOB: [id number]/ [id number]

## 2022-12-01 NOTE — Op Note (Signed)
12/01/2022  10:03 AM  PATIENT:  Wynne Dust  42 y.o. female  PRE-OPERATIVE DIAGNOSIS:  NON-RESTORABLE TEETH # 3, 4, 5, 6, 7, 8, 9, 10, 11, 12, 13, 18, 19, 20, 21, 22, 23, 24, 25, 26, 27, 28, 29, 31 SECONDARY TO PERIODONTAL DISEASE AND DENTAL CARIES  POST-OPERATIVE DIAGNOSIS:  SAME  PROCEDURE:  Procedure(s): EXTRACTION TEETH # 3, 4, 5, 6, 7, 8, 9, 10, 11, 12, 13, 18, 19, 20, 21, 22, 23, 24, 25, 26, 27, 28, 29, 31;ALVEOLOPLASTY RIGHT AND LEFT MAXILLA AND MANDIBLE  SURGEON:  Surgeon(s): Diona Browner, DMD  ANESTHESIA:   local and general  EBL:  minimal  DRAINS: none   SPECIMEN:  No Specimen  COUNTS:  YES  PLAN OF CARE: Discharge to home after PACU  PATIENT DISPOSITION:  PACU - hemodynamically stable.   PROCEDURE DETAILS: Dictation # [id number]  Gae Bon, DMD 12/01/2022 10:03 AM

## 2022-12-02 ENCOUNTER — Encounter (HOSPITAL_COMMUNITY): Payer: Self-pay | Admitting: Oral Surgery

## 2024-05-13 LAB — LAB REPORT - SCANNED
EGFR: 91
Free T4: 2.1 ng/dL
TSH: 3.34 (ref 0.41–5.90)

## 2024-06-27 ENCOUNTER — Ambulatory Visit: Payer: Self-pay | Admitting: Cardiovascular Disease

## 2024-07-07 ENCOUNTER — Encounter: Payer: Self-pay | Admitting: Cardiovascular Disease

## 2024-07-07 ENCOUNTER — Ambulatory Visit: Payer: MEDICAID | Attending: Cardiovascular Disease | Admitting: Cardiovascular Disease

## 2024-07-07 VITALS — BP 120/94 | HR 92 | Ht 66.0 in | Wt 288.0 lb

## 2024-07-07 DIAGNOSIS — Z720 Tobacco use: Secondary | ICD-10-CM

## 2024-07-07 DIAGNOSIS — R0789 Other chest pain: Secondary | ICD-10-CM | POA: Diagnosis not present

## 2024-07-07 DIAGNOSIS — R072 Precordial pain: Secondary | ICD-10-CM | POA: Diagnosis not present

## 2024-07-07 MED ORDER — METOPROLOL TARTRATE 100 MG PO TABS: ORAL_TABLET | ORAL | 0 refills | Status: AC

## 2024-07-07 NOTE — Assessment & Plan Note (Signed)
 History of atypical chest pain in the past with a coronary calcium  score performed 04/11/2019 which was 0.  At that time her aortic root measured 3.2 cm.  She is having daily chest pain.  I am going to get a coronary CTA to further evaluate.

## 2024-07-07 NOTE — Patient Instructions (Addendum)
 Medication Instructions:  Your physician recommends that you continue on your current medications as directed. Please refer to the Current Medication list given to you today.  *If you need a refill on your cardiac medications before your next appointment, please call your pharmacy*  Testing/Procedures: Your physician has requested that you have an echocardiogram. Echocardiography is a painless test that uses sound waves to create images of your heart. It provides your doctor with information about the size and shape of your heart and how well your heart's chambers and valves are working. This procedure takes approximately one hour. There are no restrictions for this procedure. Please do NOT wear cologne, perfume, aftershave, or lotions (deodorant is allowed). Please arrive 15 minutes prior to your appointment time.  Please note: We ask at that you not bring children with you during ultrasound (echo/ vascular) testing. Due to room size and safety concerns, children are not allowed in the ultrasound rooms during exams. Our front office staff cannot provide observation of children in our lobby area while testing is being conducted. An adult accompanying a patient to their appointment will only be allowed in the ultrasound room at the discretion of the ultrasound technician under special circumstances. We apologize for any inconvenience.   Follow-Up: At [facility], you and your health needs are our priority.  As part of our continuing mission to provide you with exceptional heart care, our providers are all part of one team.  This team includes your primary Cardiologist (physician) and Advanced Practice Providers or APPs (Physician Assistants and Nurse Practitioners) who all work together to provide you with the care you need, when you need it.  Your next appointment:   We will see you on an as needed basis  Provider:   Dorn Lesches, MD   We recommend signing up for the patient portal  called MyChart.  Sign up information is provided on this After Visit Summary.  MyChart is used to connect with patients for Virtual Visits (Telemedicine).  Patients are able to view lab/test results, encounter notes, upcoming appointments, etc.  Non-urgent messages can be sent to your provider as well.   To learn more about what you can do with MyChart, go to [email or web address].   Other Instructions   Your cardiac CT will be scheduled at the below location:     [facility] [address]  [place], [place] [address] [phone]   If scheduled at the Heart and Vascular Tower at [facility] street, please enter the parking lot using the [address] entrance and use the FREE valet service at the patient drop-off area. Enter the building and check-in with registration on the main floor.     Please follow these instructions carefully (unless otherwise directed):  An IV will be required for this test and Nitroglycerin  will be given.   On the Night Before the Test: Be sure to Drink plenty of water. Do not consume any caffeinated/decaffeinated beverages or chocolate 12 hours prior to your test. Do not take any antihistamines 12 hours prior to your test.   On the Day of the Test: Drink plenty of water until 1 hour prior to the test. Do not eat any food 1 hour prior to test. You may take your regular medications prior to the test.  Take metoprolol  (Lopressor )100mg  two hours prior to test. If you take Furosemide /Hydrochlorothiazide/Spironolactone/Chlorthalidone, please HOLD on the morning of the test. Patients who wear a continuous glucose monitor MUST remove the device prior  to scanning. FEMALES- please wear underwire-free bra if available, avoid dresses & tight clothing       After the Test: Drink plenty of water. After receiving IV contrast, you may experience a mild flushed feeling. This is normal. On occasion, you may experience a mild rash  up to 24 hours after the test. This is not dangerous. If this occurs, you can take Benadryl 25 mg, Zyrtec, Claritin, or Allegra and increase your fluid intake. (Patients taking Tikosyn should avoid Benadryl, and may take Zyrtec, Claritin, or Allegra) If you experience trouble breathing, this can be serious. If it is severe call 911 IMMEDIATELY. If it is mild, please call our office.  We will call to schedule your test 2-4 weeks out understanding that some insurance companies will need an authorization prior to the service being performed.   For more information and frequently asked questions, please visit our website : [email or web address]  For non-scheduling related questions, please contact the cardiac imaging nurse navigator should you have any questions/concerns: Cardiac Imaging Nurse Navigators Direct Office Dial: [phone]   For scheduling needs, including cancellations and rescheduling, please call [place], [phone].

## 2024-07-07 NOTE — Progress Notes (Signed)
 07/07/2024 Gina Santos   1981/09/06  [id number]  Primary Physician Ilah Crigler, MD Primary Cardiologist: Dorn JINNY Lesches MD [facility] [place], [place]  HPI:  Gina Santos is a 43 y.o.   severely overweight engaged Caucasian female mother of 4 children referred by Dr. Ilah for cardiovascular evaluation because of chest pain.  She is a disabled Cytogeneticist.   I last saw her virtually 03/21/2019.  She is accompanied by her mother Devere today..  Her risk factors include discontinue tobacco abuse with 20 pack years having smoked a pack a day and quit 10/18.  She has no other risk factors.  There is no family history.  She is never had a heart attack or stroke.  She is had new onset chest pain 12 months ago occurring several times a week lasting minutes at a time occurring randomly with some left upper extremity radiation and occasional shortness of breath.     Since I saw her virtually 5 years ago she continues to have daily chest pain.  I did a coronary calcium  score on her 04/11/2019 which was 0.  She did quit smoking 2/24.  Her most recent lipid profile performed 05/13/2024 revealed total cholesterol of 165, LDL 108 and HDL of 38.  Her mom recently had a 7 cm ascending thoracic aortic aneurysm which was surgically addressed.   Current Meds  Medication Sig   clonazePAM (KLONOPIN) 1 MG tablet Take 1 mg by mouth 3 (three) times daily.   methadone  (DOLOPHINE ) 10 MG/ML solution Take 150 mg by mouth daily.   omeprazole (PRILOSEC) 20 MG capsule Take 20 mg by mouth daily.   WEGOVY 1 MG/0.5ML SOAJ SQ injection SMARTSIG:0.5 SUB-Q Once a Week     Allergies  Allergen Reactions   Gabapentin Other (See Comments)     Edema   Penicillins Other (See Comments)    Delete per the patient         Social History   Socioeconomic History   Marital status: Divorced    Spouse name: Not on file   Number of children: Not on file   Years of education: Not on file   Highest education level: Not on file   Occupational History   Not on file  Tobacco Use   Smoking status: Former    Current packs/day: 1.00    Average packs/day: 1 pack/day for 30.0 years (30.0 ttl pk-yrs)    Types: Cigarettes   Smokeless tobacco: Never  Vaping Use   Vaping status: Never Used  Substance and Sexual Activity   Alcohol use: No   Drug use: No   Sexual activity: Not on file  Other Topics Concern   Not on file  Social History Narrative   Not on file   Social Drivers of Health   Financial Resource Strain: Not on file  Food Insecurity: Not on file  Transportation Needs: Not on file  Physical Activity: Not on file  Stress: Not on file  Social Connections: Not on file  Intimate Partner Violence: Not on file     Review of Systems: General: negative for chills, fever, night sweats or weight changes.  Cardiovascular: negative for chest pain, dyspnea on exertion, edema, orthopnea, palpitations, paroxysmal nocturnal dyspnea or shortness of breath Dermatological: negative for rash Respiratory: negative for cough or wheezing Urologic: negative for hematuria Abdominal: negative for nausea, vomiting, diarrhea, bright red blood per rectum, melena, or hematemesis Neurologic: negative for visual changes, syncope, or dizziness All other systems reviewed and are  otherwise negative except as noted above.    Blood pressure (!) 120/94, pulse 92, height 5' 6 (1.676 m), weight 288 lb (130.6 kg), SpO2 97%.  General appearance: alert and no distress Neck: no adenopathy, no carotid bruit, no JVD, supple, symmetrical, trachea midline, and thyroid not enlarged, symmetric, no tenderness/mass/nodules Lungs: clear to auscultation bilaterally Heart: regular rate and rhythm, S1, S2 normal, no murmur, click, rub or gallop Extremities: extremities normal, atraumatic, no cyanosis or edema Pulses: 2+ and symmetric Skin: Skin color, texture, turgor normal. No rashes or lesions Neurologic: Grossly normal  EKG EKG  Interpretation Date/Time:  Monday July 07 2024 14:28:41 EDT Ventricular Rate:  92 PR Interval:  144 QRS Duration:  68 QT Interval:  354 QTC Calculation: 437 R Axis:   51  Text Interpretation: Normal sinus rhythm Normal ECG When compared with ECG of 29-Jun-2018 18:36, Nonspecific T wave abnormality no longer evident in Anterior leads Confirmed by Court Carrier [phone]) on 07/07/2024 2:31:01 PM    ASSESSMENT AND PLAN:   Tobacco abuse Discontinue tobacco abuse 2/24.  She did have over 20 pack years of smoking in the past.  Atypical chest pain History of atypical chest pain in the past with a coronary calcium  score performed 04/11/2019 which was 0.  At that time her aortic root measured 3.2 cm.  She is having daily chest pain.  I am going to get a coronary CTA to further evaluate.     Carrier DOROTHA Court MD FACP,FACC,FAHA, [facility] 07/07/2024 2:44 PM

## 2024-07-07 NOTE — Assessment & Plan Note (Signed)
 Discontinue tobacco abuse 2/24.  She did have over 20 pack years of smoking in the past.

## 2024-08-04 ENCOUNTER — Encounter (HOSPITAL_COMMUNITY): Payer: Self-pay

## 2024-08-06 ENCOUNTER — Ambulatory Visit (HOSPITAL_COMMUNITY)
Admission: RE | Admit: 2024-08-06 | Discharge: 2024-08-06 | Disposition: A | Discharge status: Home / Self Care | Payer: MEDICAID | Source: Ambulatory Visit | Attending: Cardiovascular Disease | Admitting: Cardiovascular Disease

## 2024-08-06 DIAGNOSIS — R072 Precordial pain: Secondary | ICD-10-CM | POA: Insufficient documentation

## 2024-08-06 MED ORDER — NITROGLYCERIN 0.4 MG SL SUBL
0.8000 mg | SUBLINGUAL_TABLET | Freq: Once | SUBLINGUAL | Status: AC
  Administered 2024-08-06: 0.8 mg via SUBLINGUAL

## 2024-08-06 MED ORDER — IOHEXOL 350 MG/ML SOLN
125.0000 mL | Freq: Once | INTRAVENOUS | Status: AC | PRN
Start: 2024-08-06 — End: 2024-08-06
  Administered 2024-08-06: 125 mL via INTRAVENOUS

## 2024-08-07 ENCOUNTER — Ambulatory Visit: Payer: Self-pay | Admitting: Cardiovascular Disease

## 2024-08-13 ENCOUNTER — Ambulatory Visit (HOSPITAL_COMMUNITY)
Admission: RE | Admit: 2024-08-13 | Discharge: 2024-08-13 | Disposition: A | Discharge status: Home / Self Care | Payer: MEDICAID | Source: Ambulatory Visit | Attending: Cardiovascular Disease | Admitting: Cardiovascular Disease

## 2024-08-13 DIAGNOSIS — R0789 Other chest pain: Secondary | ICD-10-CM | POA: Diagnosis present

## 2024-08-13 DIAGNOSIS — Z720 Tobacco use: Secondary | ICD-10-CM | POA: Insufficient documentation

## 2024-08-13 DIAGNOSIS — R072 Precordial pain: Secondary | ICD-10-CM | POA: Diagnosis not present

## 2024-08-13 LAB — ECHOCARDIOGRAM COMPLETE
Area-P 1/2: 3.08 cm2
S' Lateral: 2.95 cm

## 2024-08-19 ENCOUNTER — Emergency Department (HOSPITAL_BASED_OUTPATIENT_CLINIC_OR_DEPARTMENT_OTHER)

## 2024-08-19 ENCOUNTER — Other Ambulatory Visit: Payer: Self-pay

## 2024-08-19 ENCOUNTER — Emergency Department (HOSPITAL_BASED_OUTPATIENT_CLINIC_OR_DEPARTMENT_OTHER): Admission: EM | Admit: 2024-08-19 | Discharge: 2024-08-19 | Disposition: A | Discharge status: Home / Self Care

## 2024-08-19 DIAGNOSIS — D649 Anemia, unspecified: Secondary | ICD-10-CM | POA: Insufficient documentation

## 2024-08-19 DIAGNOSIS — Z79899 Other long term (current) drug therapy: Secondary | ICD-10-CM | POA: Insufficient documentation

## 2024-08-19 DIAGNOSIS — R0782 Intercostal pain: Secondary | ICD-10-CM | POA: Diagnosis not present

## 2024-08-19 DIAGNOSIS — R600 Localized edema: Secondary | ICD-10-CM | POA: Insufficient documentation

## 2024-08-19 DIAGNOSIS — M7989 Other specified soft tissue disorders: Secondary | ICD-10-CM | POA: Diagnosis present

## 2024-08-19 LAB — CBC
HCT: 27.2 % — ABNORMAL LOW (ref 36.0–46.0)
Hemoglobin: 8.9 g/dL — ABNORMAL LOW (ref 12.0–15.0)
MCH: 26.6 pg (ref 26.0–34.0)
MCHC: 32.7 g/dL (ref 30.0–36.0)
MCV: 81.4 fL (ref 80.0–100.0)
Platelets: 306 K/uL (ref 150–400)
RBC: 3.34 MIL/uL — ABNORMAL LOW (ref 3.87–5.11)
RDW: 18.6 % — ABNORMAL HIGH (ref 11.5–15.5)
WBC: 5.6 K/uL (ref 4.0–10.5)
nRBC: 0 % (ref 0.0–0.2)

## 2024-08-19 LAB — BASIC METABOLIC PANEL WITH GFR
Anion gap: 9 (ref 5–15)
BUN: 8 mg/dL (ref 6–20)
CO2: 28 mmol/L (ref 22–32)
Calcium: 9.2 mg/dL (ref 8.9–10.3)
Chloride: 102 mmol/L (ref 98–111)
Creatinine, Ser: 0.76 mg/dL (ref 0.44–1.00)
GFR, Estimated: >60 mL/min (ref >60)
Glucose, Bld: 121 mg/dL — ABNORMAL HIGH (ref 70–99)
Potassium: 4.2 mmol/L (ref 3.5–5.1)
Sodium: 139 mmol/L (ref 135–145)

## 2024-08-19 LAB — TROPONIN T, HIGH SENSITIVITY: Troponin T High Sensitivity: <15 ng/L (ref 0–19)

## 2024-08-19 LAB — D-DIMER, QUANTITATIVE: D-Dimer, Quant: 0.76 ug{FEU}/mL — ABNORMAL HIGH (ref 0.00–0.50)

## 2024-08-19 LAB — PRO BRAIN NATRIURETIC PEPTIDE: Pro Brain Natriuretic Peptide: 60.3 pg/mL (ref <300.0)

## 2024-08-19 MED ORDER — FUROSEMIDE 20 MG PO TABS: 20.0000 mg | ORAL_TABLET | Freq: Every day | ORAL | 0 refills | Status: AC

## 2024-08-19 MED ORDER — IOHEXOL 350 MG/ML SOLN
75.0000 mL | Freq: Once | INTRAVENOUS | Status: AC | PRN
  Administered 2024-08-19: 75 mL via INTRAVENOUS

## 2024-08-19 MED ORDER — CEPHALEXIN 500 MG PO CAPS: 500.0000 mg | ORAL_CAPSULE | Freq: Four times a day (QID) | ORAL | 0 refills | Status: AC

## 2024-08-19 NOTE — Discharge Instructions (Addendum)
 Given your workup with cardiology has been unremarkable, I do think it be beneficial to follow-up with vascular surgery.  I wonder if you have some venous insufficiency that could be leading to the peripheral edema.  Please give them a call to establish care.  For your lower extremities, please purchase stockings or other tight fitting along socks that goes past your knees to help with the edema.  Please keep your legs elevated at night.  I will start you on a very small dose of Lasix  over the next week to see if this helps as well.  Otherwise, your workup for blood clot was unremarkable.  No blood clots in your legs or chest.  If anything changes, please come at the ED for further care  Your hemoglobin is a little low compared to your baseline.  Your hemoglobin level today was 8.9.  Last hemoglobin was around 11 but this was over a year ago.  If you notice any bright red blood in your stool or dark stools then please come back to the ED.  Otherwise, please have repeat laboratory workup by PCP to make sure this is not continued to drop in the next couple weeks.

## 2024-08-19 NOTE — ED Provider Notes (Addendum)
 [place] [facility]ERGENCY DEPARTMENT AT [facility] Provider Note   CSN: [id number] Arrival date & time: 08/19/24  1426     Patient presents with: Leg Swelling   Gina Santos is a 43 y.o. female.   HPI    Presents because of lower extremity swelling.  Patient states that she always has a degree of swelling of bilateral lower extremity but the leg is getting worse over the past week or so.  Patient states that she has noticed some clear liquid weeping from her legs as well.  Does not take any kind of diuretic.SABRA  No history of DVT or PE.  Endorses some slight pleuritic chest pain in the left side of her chest has been present ever since they did the echo a week ago.  No recent travel history.  No hormone use.  Endorses bilateral lower extremity swelling.  No recent travel.    Previous medical history reviewed : Follows up with cardiology.  Last saw cardiology on July 07, 2024.  Seen because of chest pain at that time.  Atypical chest pain history.  Coronary calcium  score performed in 2020 which was 0.  Had repeat CT imaging in October 2025.  Unremarkable.  Echo completed in October as well.  EF 60 to 65%.  Essentially normal study   Prior to Admission medications   Medication Sig Start Date End Date Taking? Authorizing Provider  cephALEXin (KEFLEX) 500 MG capsule Take 1 capsule (500 mg total) by mouth 4 (four) times daily for 7 days. 08/19/24 08/26/24 Yes Simon Lavonia SAILOR, MD  furosemide  (LASIX ) 20 MG tablet Take 1 tablet (20 mg total) by mouth daily. 08/19/24  Yes Simon Lavonia SAILOR, MD  clindamycin  (CLEOCIN ) 300 MG capsule Take 1 capsule (300 mg total) by mouth 3 (three) times daily. Patient not taking: Reported on 07/07/2024 12/01/22   Sheryle Hamilton, DMD  clonazePAM (KLONOPIN) 1 MG tablet Take 1 mg by mouth 3 (three) times daily. 06/15/24   [provider]  methadone  (DOLOPHINE ) 10 MG/ML solution Take 150 mg by mouth daily.    [provider]  metoprolol  tartrate  (LOPRESSOR ) 100 MG tablet Take 1 tablet (100mg ) TWO hours prior to CT scan 07/07/24   Court Dorn PARAS, MD  omeprazole (PRILOSEC) 20 MG capsule Take 20 mg by mouth daily.    [provider]  oxyCODONE -acetaminophen  (PERCOCET) 5-325 MG tablet Take 1 tablet by mouth every 4 (four) hours as needed. Patient not taking: Reported on 07/07/2024 12/01/22   Sheryle Hamilton, DMD  WEGOVY 1 MG/0.5ML SOAJ SQ injection SMARTSIG:0.5 SUB-Q Once a Week 06/22/24   [provider]    Allergies: Gabapentin and Penicillins    Review of Systems  Constitutional:  Negative for chills and fever.  HENT:  Negative for ear pain and sore throat.   Eyes:  Negative for pain and visual disturbance.  Respiratory:  Negative for cough and shortness of breath.   Cardiovascular:  Negative for chest pain and palpitations.  Gastrointestinal:  Negative for abdominal pain and vomiting.  Genitourinary:  Negative for dysuria and hematuria.  Musculoskeletal:  Negative for arthralgias and back pain.  Skin:  Negative for color change and rash.  Neurological:  Negative for seizures and syncope.  All other systems reviewed and are negative.   Updated Vital Signs BP 112/64   Pulse 86   Temp 98.8 F (37.1 C) (Oral)   Resp 20   SpO2 95%   Physical Exam Vitals and nursing note reviewed.  Constitutional:  General: She is not in acute distress.    Appearance: She is well-developed.  HENT:     Head: Normocephalic and atraumatic.  Eyes:     Conjunctiva/sclera: Conjunctivae normal.  Cardiovascular:     Rate and Rhythm: Normal rate and regular rhythm.     Heart sounds: No murmur heard. Pulmonary:     Effort: Pulmonary effort is normal. No respiratory distress.     Breath sounds: Normal breath sounds.  Abdominal:     Palpations: Abdomen is soft.     Tenderness: There is no abdominal tenderness.  Musculoskeletal:        General: No swelling.     Cervical back: Neck supple.     Right lower leg: Edema present.      Left lower leg: Edema present.  Skin:    General: Skin is warm and dry.     Capillary Refill: Capillary refill takes less than 2 seconds.  Neurological:     Mental Status: She is alert.  Psychiatric:        Mood and Affect: Mood normal.     (all labs ordered are listed, but only abnormal results are displayed) Labs Reviewed  BASIC METABOLIC PANEL WITH GFR - Abnormal; Notable for the following components:      Result Value   Glucose, Bld 121 (*)    All other components within normal limits  CBC - Abnormal; Notable for the following components:   RBC 3.34 (*)    Hemoglobin 8.9 (*)    HCT 27.2 (*)    RDW 18.6 (*)    All other components within normal limits  D-DIMER, QUANTITATIVE - Abnormal; Notable for the following components:   D-Dimer, Quant 0.76 (*)    All other components within normal limits  PRO BRAIN NATRIURETIC PEPTIDE  TROPONIN T, HIGH SENSITIVITY  TROPONIN T, HIGH SENSITIVITY    EKG: EKG Interpretation Date/Time:  Tuesday August 19 2024 17:06:45 EDT Ventricular Rate:  91 PR Interval:  144 QRS Duration:  70 QT Interval:  374 QTC Calculation: 461 R Axis:   34  Text Interpretation: Sinus rhythm Abnormal R-wave progression, early transition Borderline T wave abnormalities Confirmed by Simon Rea [phone]) on 08/19/2024 6:15:11 PM  Radiology: US  Venous Img Lower Bilateral Result Date: 08/19/2024 CLINICAL DATA:  Bilateral lower extremity edema. EXAM: BILATERAL LOWER EXTREMITY VENOUS DOPPLER ULTRASOUND TECHNIQUE: Gray-scale sonography with graded compression, as well as color Doppler and duplex ultrasound were performed to evaluate the lower extremity deep venous systems from the level of the common femoral vein and including the common femoral, femoral, profunda femoral, popliteal and calf veins including the posterior tibial, peroneal and gastrocnemius veins when visible. The superficial great saphenous vein was also interrogated. Spectral Doppler was utilized  to evaluate flow at rest and with distal augmentation maneuvers in the common femoral, femoral and popliteal veins. COMPARISON:  None Available. FINDINGS: RIGHT LOWER EXTREMITY Common Femoral Vein: No evidence of thrombus. Normal compressibility, respiratory phasicity and response to augmentation. Saphenofemoral Junction: No evidence of thrombus. Normal compressibility and flow on color Doppler imaging. Profunda Femoral Vein: No evidence of thrombus. Normal compressibility and flow on color Doppler imaging. Femoral Vein: No evidence of thrombus. Normal compressibility, respiratory phasicity and response to augmentation. Popliteal Vein: No evidence of thrombus. Normal compressibility, respiratory phasicity and response to augmentation. Calf Veins: No evidence of thrombus. Normal compressibility and flow on color Doppler imaging. Superficial Great Saphenous Vein: No evidence of thrombus. Normal compressibility. Venous Reflux:  None. Other Findings:  None. LEFT LOWER EXTREMITY Common Femoral Vein: No evidence of thrombus. Normal compressibility, respiratory phasicity and response to augmentation. Saphenofemoral Junction: No evidence of thrombus. Normal compressibility and flow on color Doppler imaging. Profunda Femoral Vein: No evidence of thrombus. Normal compressibility and flow on color Doppler imaging. Femoral Vein: No evidence of thrombus. Normal compressibility, respiratory phasicity and response to augmentation. Popliteal Vein: No evidence of thrombus. Normal compressibility, respiratory phasicity and response to augmentation. Calf Veins: No evidence of thrombus. Normal compressibility and flow on color Doppler imaging. Superficial Great Saphenous Vein: No evidence of thrombus. Normal compressibility. Venous Reflux:  None. Other Findings:  None. IMPRESSION: No evidence of deep venous thrombosis in either lower extremity. Electronically Signed   By: Suzen Dials M.D.   On: 08/19/2024 19:20   CT Angio Chest  PE W and/or Wo Contrast Result Date: 08/19/2024 CLINICAL DATA:  Bilateral leg swelling. EXAM: CT ANGIOGRAPHY CHEST WITH CONTRAST TECHNIQUE: Multidetector CT imaging of the chest was performed using the standard protocol during bolus administration of intravenous contrast. Multiplanar CT image reconstructions and MIPs were obtained to evaluate the vascular anatomy. RADIATION DOSE REDUCTION: This exam was performed according to the departmental dose-optimization program which includes automated exposure control, adjustment of the mA and/or kV according to patient size and/or use of iterative reconstruction technique. CONTRAST:  75mL OMNIPAQUE  IOHEXOL  350 MG/ML SOLN COMPARISON:  August 06, 2024 FINDINGS: Cardiovascular: Satisfactory opacification of the pulmonary arteries to the segmental level. No evidence of pulmonary embolism. Normal heart size. No pericardial effusion. Mediastinum/Nodes: No enlarged mediastinal, hilar, or axillary lymph nodes. Thyroid gland, trachea, and esophagus demonstrate no significant findings. Lungs/Pleura: There is a stable 4 mm anterior lingular pulmonary nodule (stable since 2020). No acute infiltrate, pleural effusion or pneumothorax. Upper Abdomen: No acute abnormality. Musculoskeletal: No chest wall abnormality. No acute or significant osseous findings. Review of the MIP images confirms the above findings. IMPRESSION: 1. No evidence of pulmonary embolism or other acute intrathoracic process. 2. Stable, likely benign 4 mm anterior lingular pulmonary nodule. Electronically Signed   By: Suzen Dials M.D.   On: 08/19/2024 18:07   DG Chest Port 1 View Result Date: 08/19/2024 EXAM: 1 VIEW(S) XRAY OF THE CHEST 08/19/2024 04:54:48 PM COMPARISON: 8 / 31 / 19 CLINICAL HISTORY: SOB FINDINGS: LUNGS AND PLEURA: No focal pulmonary opacity. No pulmonary edema. No pleural effusion. No pneumothorax. HEART AND MEDIASTINUM: No acute abnormality of the cardiac and mediastinal silhouettes. BONES  AND SOFT TISSUES: No acute osseous abnormality. IMPRESSION: 1. Normal chest radiograph. No acute cardiopulmonary process. Electronically signed by: Gina Gatlin MD 08/19/2024 05:05 PM EDT RP Workstation: [id number]     Procedures   Medications Ordered in the ED  iohexol  (OMNIPAQUE ) 350 MG/ML injection 75 mL (75 mLs Intravenous Contrast Given 08/19/24 1752)                                Geneva (Revised) Score: 5, Geneva Score Interpretation: Moderate Risk Group: ~20-30% incidence of pulmonary embolism from several studies PERC Score: 1, PERC Score Interpretation: If any criteria are positive, the PERC rule cannot be used to rule out PE in this patient Medical Decision Making Amount and/or Complexity of Data Reviewed Labs: ordered. Radiology: ordered.  Risk Prescription drug management.     HPI:  Presents because of lower extremity swelling.  Patient states that she always has a degree of swelling of bilateral lower extremity but the leg is getting worse  over the past week or so.  Patient states that she has noticed some clear liquid weeping from her legs as well.  Does not take any kind of diuretic.SABRA  No history of DVT or PE.  Endorses some slight pleuritic chest pain in the left side of her chest has been present ever since they did the echo a week ago.  No recent travel history.  No hormone use.  Endorses bilateral lower extremity swelling.  No recent travel.    Previous medical history reviewed : Follows up with cardiology.  Last saw cardiology on July 07, 2024.  Seen because of chest pain at that time.  Atypical chest pain history.  Coronary calcium  score performed in 2020 which was 0.  Had repeat CT imaging in October 2025.  Unremarkable.  Echo completed in October as well.  EF 60 to 65%.  Essentially normal study  MDM:   Patient has 1-2+ lower extremity edema.  2+ pulses both dorsal pedal and posterior tibial bilaterally.  No concerns making ischemic pathology.  Will  obtain DVT ultrasound to rule out DVT.  Patient slightly tachycardic when examined the patient.  Put her chest pain as well.  Will obtain D-dimer to rule out PE.  No large risk factors for PE.  Do not need to get CTA immediately.  Will obtain EKG and troponin.  If initial troponin unremarkable, will not need to repeat given negative calcium  scores here recently as well as symptoms been greater than 24 hours  Reevaluation:   DVT study unremarkable.  D-dimer was positive so did obtain CTA chest.  Negative PE.  No infiltrate.  No concerns for pneumonia.   EKG sinus rhythm.  No STEMI.  Troponin x 1 unremarkable.  No indication for repeat given negative calcium  score and symptom chronicity.  Likely MSK given reproducibility on the left side and hurts with certain movements.  For the lower extremity edema.  Question whether not this could be venous insufficiency.  Will follow-up with vascular surgery.  As above, last echo was normal.  Will start on gentle diuresis 20 mg of the next week to see if this helps.  Recommended compression stockings.  Feet elevation.  Will start patient on Keflex given some slight erythema of the right lower extremity.  Likely more related to edema.  No leukocytosis but she has a small opening wound with some surrounding erythema so we will cover for cellulitic changes with Keflex  Patient did have some mild anemia.  Hemoglobin 8.9.  Baseline looks around 11.  No recent blood in her stool that she can appreciate.  No abdominal pain.  Recommend follow-up with PCP for repeat testing in the next couple weeks to make sure this does not continue to drop  EKG Interpreted by Me: NSR   Cardiac Tele Interpreted by Me: NSR   I have independently interpreted the u/s andCT  images and agree with the radiologist finding   Social Determinant of Health: None   Disposition and Follow Up: Vascular       Final diagnoses:  Lower extremity edema  Intercostal pain  Anemia,  unspecified type    ED Discharge Orders          Ordered    furosemide  (LASIX ) 20 MG tablet  Daily        08/19/24 1925    cephALEXin (KEFLEX) 500 MG capsule  4 times daily        08/19/24 1954  Simon Lavonia SAILOR, MD 08/19/24 VITO    Simon Lavonia SAILOR, MD 08/19/24 2003

## 2024-08-19 NOTE — ED Triage Notes (Signed)
 C/o bilateral leg swelling and weeping. Recently had ECHO and Stress test done and was all normal. Denies CP or SHOB,.

## 2024-08-19 NOTE — ED Notes (Signed)

## 2024-10-19 ENCOUNTER — Other Ambulatory Visit: Payer: Self-pay | Admitting: Cardiovascular Disease

## 2024-11-25 ENCOUNTER — Ambulatory Visit: Payer: MEDICAID | Admitting: Cardiovascular Disease

## 2024-12-09 ENCOUNTER — Ambulatory Visit: Payer: MEDICAID | Admitting: Cardiovascular Disease
